# Patient Record
Sex: Male | Born: 1950 | Race: White | Hispanic: No | Smoking: Never smoker
Health system: Southern US, Community
[De-identification: ages and names within clinical notes are randomized; demographics above are authoritative.]

## PROBLEM LIST (undated history)

## (undated) DIAGNOSIS — K4090 Unilateral inguinal hernia, without obstruction or gangrene, not specified as recurrent: Secondary | ICD-10-CM

## (undated) DIAGNOSIS — E785 Hyperlipidemia, unspecified: Secondary | ICD-10-CM

## (undated) DIAGNOSIS — I10 Essential (primary) hypertension: Secondary | ICD-10-CM

## (undated) DIAGNOSIS — I4891 Unspecified atrial fibrillation: Secondary | ICD-10-CM

## (undated) DIAGNOSIS — F319 Bipolar disorder, unspecified: Secondary | ICD-10-CM

## (undated) DIAGNOSIS — I2510 Atherosclerotic heart disease of native coronary artery without angina pectoris: Secondary | ICD-10-CM

## (undated) DIAGNOSIS — M109 Gout, unspecified: Secondary | ICD-10-CM

## (undated) HISTORY — PX: CORONARY ARTERY BYPASS GRAFT: SHX141

## (undated) HISTORY — PX: KNEE ARTHROSCOPY: SUR90

## (undated) HISTORY — PX: BILATERAL HEMI SHOULDER ARTHROPLASTY: SHX6442

## (undated) HISTORY — PX: TOTAL HIP ARTHROPLASTY: SHX124

---

## 2016-03-17 ENCOUNTER — Encounter (HOSPITAL_BASED_OUTPATIENT_CLINIC_OR_DEPARTMENT_OTHER): Payer: Self-pay

## 2016-03-17 ENCOUNTER — Inpatient Hospital Stay (HOSPITAL_BASED_OUTPATIENT_CLINIC_OR_DEPARTMENT_OTHER)
Admission: EM | Admit: 2016-03-17 | Discharge: 2016-03-23 | DRG: 871 | Disposition: A | Discharge status: Skilled Nursing Facility | Payer: Medicare Other | Attending: Internal Medicine | Admitting: Internal Medicine

## 2016-03-17 ENCOUNTER — Emergency Department (HOSPITAL_BASED_OUTPATIENT_CLINIC_OR_DEPARTMENT_OTHER): Payer: Medicare Other

## 2016-03-17 DIAGNOSIS — R748 Abnormal levels of other serum enzymes: Secondary | ICD-10-CM | POA: Diagnosis not present

## 2016-03-17 DIAGNOSIS — R6521 Severe sepsis with septic shock: Secondary | ICD-10-CM | POA: Diagnosis present

## 2016-03-17 DIAGNOSIS — J9601 Acute respiratory failure with hypoxia: Secondary | ICD-10-CM | POA: Diagnosis present

## 2016-03-17 DIAGNOSIS — Z9181 History of falling: Secondary | ICD-10-CM

## 2016-03-17 DIAGNOSIS — L8990 Pressure ulcer of unspecified site, unspecified stage: Secondary | ICD-10-CM | POA: Diagnosis not present

## 2016-03-17 DIAGNOSIS — K5900 Constipation, unspecified: Secondary | ICD-10-CM | POA: Diagnosis present

## 2016-03-17 DIAGNOSIS — Z66 Do not resuscitate: Secondary | ICD-10-CM | POA: Diagnosis present

## 2016-03-17 DIAGNOSIS — Y95 Nosocomial condition: Secondary | ICD-10-CM | POA: Diagnosis present

## 2016-03-17 DIAGNOSIS — Z681 Body mass index (BMI) 19 or less, adult: Secondary | ICD-10-CM | POA: Diagnosis not present

## 2016-03-17 DIAGNOSIS — R4702 Dysphasia: Secondary | ICD-10-CM | POA: Diagnosis present

## 2016-03-17 DIAGNOSIS — E43 Unspecified severe protein-calorie malnutrition: Secondary | ICD-10-CM | POA: Diagnosis present

## 2016-03-17 DIAGNOSIS — Y92239 Unspecified place in hospital as the place of occurrence of the external cause: Secondary | ICD-10-CM | POA: Diagnosis not present

## 2016-03-17 DIAGNOSIS — F319 Bipolar disorder, unspecified: Secondary | ICD-10-CM | POA: Diagnosis present

## 2016-03-17 DIAGNOSIS — W19XXXA Unspecified fall, initial encounter: Secondary | ICD-10-CM | POA: Diagnosis not present

## 2016-03-17 DIAGNOSIS — L89510 Pressure ulcer of right ankle, unstageable: Secondary | ICD-10-CM | POA: Diagnosis present

## 2016-03-17 DIAGNOSIS — J189 Pneumonia, unspecified organism: Secondary | ICD-10-CM | POA: Diagnosis present

## 2016-03-17 DIAGNOSIS — J1289 Other viral pneumonia: Secondary | ICD-10-CM | POA: Diagnosis present

## 2016-03-17 DIAGNOSIS — Z96612 Presence of left artificial shoulder joint: Secondary | ICD-10-CM | POA: Diagnosis present

## 2016-03-17 DIAGNOSIS — E785 Hyperlipidemia, unspecified: Secondary | ICD-10-CM | POA: Diagnosis present

## 2016-03-17 DIAGNOSIS — I2510 Atherosclerotic heart disease of native coronary artery without angina pectoris: Secondary | ICD-10-CM | POA: Diagnosis present

## 2016-03-17 DIAGNOSIS — Z96611 Presence of right artificial shoulder joint: Secondary | ICD-10-CM | POA: Diagnosis present

## 2016-03-17 DIAGNOSIS — R0600 Dyspnea, unspecified: Secondary | ICD-10-CM | POA: Diagnosis not present

## 2016-03-17 DIAGNOSIS — I10 Essential (primary) hypertension: Secondary | ICD-10-CM | POA: Diagnosis present

## 2016-03-17 DIAGNOSIS — M109 Gout, unspecified: Secondary | ICD-10-CM | POA: Diagnosis present

## 2016-03-17 DIAGNOSIS — N179 Acute kidney failure, unspecified: Secondary | ICD-10-CM | POA: Diagnosis not present

## 2016-03-17 DIAGNOSIS — Z791 Long term (current) use of non-steroidal anti-inflammatories (NSAID): Secondary | ICD-10-CM

## 2016-03-17 DIAGNOSIS — F0390 Unspecified dementia without behavioral disturbance: Secondary | ICD-10-CM | POA: Diagnosis present

## 2016-03-17 DIAGNOSIS — B9729 Other coronavirus as the cause of diseases classified elsewhere: Secondary | ICD-10-CM | POA: Diagnosis present

## 2016-03-17 DIAGNOSIS — E876 Hypokalemia: Secondary | ICD-10-CM | POA: Diagnosis present

## 2016-03-17 DIAGNOSIS — A419 Sepsis, unspecified organism: Secondary | ICD-10-CM | POA: Diagnosis present

## 2016-03-17 DIAGNOSIS — Z79899 Other long term (current) drug therapy: Secondary | ICD-10-CM

## 2016-03-17 DIAGNOSIS — F419 Anxiety disorder, unspecified: Secondary | ICD-10-CM | POA: Diagnosis present

## 2016-03-17 DIAGNOSIS — S0081XA Abrasion of other part of head, initial encounter: Secondary | ICD-10-CM | POA: Diagnosis not present

## 2016-03-17 DIAGNOSIS — Z7982 Long term (current) use of aspirin: Secondary | ICD-10-CM

## 2016-03-17 DIAGNOSIS — Z951 Presence of aortocoronary bypass graft: Secondary | ICD-10-CM

## 2016-03-17 DIAGNOSIS — R1310 Dysphagia, unspecified: Secondary | ICD-10-CM | POA: Diagnosis present

## 2016-03-17 DIAGNOSIS — I4891 Unspecified atrial fibrillation: Secondary | ICD-10-CM | POA: Diagnosis present

## 2016-03-17 DIAGNOSIS — Z7901 Long term (current) use of anticoagulants: Secondary | ICD-10-CM

## 2016-03-17 HISTORY — DX: Essential (primary) hypertension: I10

## 2016-03-17 HISTORY — DX: Unspecified atrial fibrillation: I48.91

## 2016-03-17 HISTORY — DX: Gout, unspecified: M10.9

## 2016-03-17 HISTORY — DX: Hyperlipidemia, unspecified: E78.5

## 2016-03-17 HISTORY — DX: Bipolar disorder, unspecified: F31.9

## 2016-03-17 HISTORY — DX: Unilateral inguinal hernia, without obstruction or gangrene, not specified as recurrent: K40.90

## 2016-03-17 LAB — CBC WITH DIFFERENTIAL/PLATELET
BASOS ABS: 0 10*3/uL (ref 0.0–0.1)
Basophils Relative: 1 %
Eosinophils Absolute: 0 10*3/uL (ref 0.0–0.7)
Eosinophils Relative: 0 %
HEMATOCRIT: 39.5 % (ref 39.0–52.0)
Hemoglobin: 13.5 g/dL (ref 13.0–17.0)
LYMPHS PCT: 12 %
Lymphs Abs: 0.3 10*3/uL — ABNORMAL LOW (ref 0.7–4.0)
MCH: 33.4 pg (ref 26.0–34.0)
MCHC: 34.2 g/dL (ref 30.0–36.0)
MCV: 97.8 fL (ref 78.0–100.0)
MONOS PCT: 4 %
Monocytes Absolute: 0.1 10*3/uL (ref 0.1–1.0)
NEUTROS ABS: 1.7 10*3/uL (ref 1.7–7.7)
Neutrophils Relative %: 83 %
Platelets: 200 10*3/uL (ref 150–400)
RBC: 4.04 MIL/uL — ABNORMAL LOW (ref 4.22–5.81)
RDW: 11.8 % (ref 11.5–15.5)
WBC: 2 10*3/uL — ABNORMAL LOW (ref 4.0–10.5)

## 2016-03-17 LAB — COMPREHENSIVE METABOLIC PANEL
ALBUMIN: 3.3 g/dL — AB (ref 3.5–5.0)
ALT: 21 U/L (ref 17–63)
ANION GAP: 8 (ref 5–15)
AST: 43 U/L — ABNORMAL HIGH (ref 15–41)
Alkaline Phosphatase: 78 U/L (ref 38–126)
BUN: 30 mg/dL — ABNORMAL HIGH (ref 6–20)
CHLORIDE: 107 mmol/L (ref 101–111)
CO2: 25 mmol/L (ref 22–32)
Calcium: 9.2 mg/dL (ref 8.9–10.3)
Creatinine, Ser: 1.34 mg/dL — ABNORMAL HIGH (ref 0.61–1.24)
GFR calc Af Amer: >60 mL/min (ref >60)
GFR calc non Af Amer: 54 mL/min — ABNORMAL LOW (ref >60)
Glucose, Bld: 75 mg/dL (ref 65–99)
POTASSIUM: 4.3 mmol/L (ref 3.5–5.1)
SODIUM: 140 mmol/L (ref 135–145)
TOTAL PROTEIN: 6.2 g/dL — AB (ref 6.5–8.1)
Total Bilirubin: 1.1 mg/dL (ref 0.3–1.2)

## 2016-03-17 LAB — BLOOD GAS, ARTERIAL
ACID-BASE DEFICIT: 2 mmol/L (ref 0.0–2.0)
BICARBONATE: 21.5 mmol/L (ref 20.0–28.0)
DRAWN BY: 398661
FIO2: 100
O2 CONTENT: 15 L/min
O2 SAT: 91.7 %
PATIENT TEMPERATURE: 98.6
PO2 ART: 62.2 mmHg — AB (ref 83.0–108.0)
pCO2 arterial: 32 mmHg (ref 32.0–48.0)
pH, Arterial: 7.442 (ref 7.350–7.450)

## 2016-03-17 LAB — LACTIC ACID, PLASMA: Lactic Acid, Venous: 2.6 mmol/L (ref 0.5–1.9)

## 2016-03-17 LAB — I-STAT CG4 LACTIC ACID, ED
LACTIC ACID, VENOUS: 3.79 mmol/L — AB (ref 0.5–1.9)
Lactic Acid, Venous: 2.86 mmol/L (ref 0.5–1.9)

## 2016-03-17 LAB — GLUCOSE, CAPILLARY: GLUCOSE-CAPILLARY: 85 mg/dL (ref 65–99)

## 2016-03-17 LAB — PROCALCITONIN: PROCALCITONIN: 21.22 ng/mL

## 2016-03-17 LAB — MRSA PCR SCREENING: MRSA by PCR: NEGATIVE

## 2016-03-17 LAB — APTT: APTT: 52 s — AB (ref 24–36)

## 2016-03-17 LAB — HEPARIN LEVEL (UNFRACTIONATED)

## 2016-03-17 LAB — TROPONIN I: TROPONIN I: 0.51 ng/mL — AB (ref <0.03)

## 2016-03-17 MED ORDER — SODIUM CHLORIDE 0.9 % IV BOLUS (SEPSIS)
1000.0000 mL | Freq: Once | INTRAVENOUS | Status: AC
  Administered 2016-03-17: 1000 mL via INTRAVENOUS

## 2016-03-17 MED ORDER — HEPARIN (PORCINE) IN NACL 100-0.45 UNIT/ML-% IJ SOLN
750.0000 [IU]/h | INTRAMUSCULAR | Status: DC
  Administered 2016-03-17: 750 [IU]/h via INTRAVENOUS
  Filled 2016-03-17: qty 250

## 2016-03-17 MED ORDER — SODIUM CHLORIDE 0.9 % IV SOLN: 250.0000 mL | INTRAVENOUS | Status: DC | PRN

## 2016-03-17 MED ORDER — PHENYLEPHRINE HCL 10 MG/ML IJ SOLN
0.0000 ug/min | Freq: Once | INTRAVENOUS | Status: AC
  Administered 2016-03-17: 20 ug/min via INTRAVENOUS
  Filled 2016-03-17: qty 1

## 2016-03-17 MED ORDER — SODIUM CHLORIDE 0.9 % IV BOLUS (SEPSIS)
500.0000 mL | Freq: Once | INTRAVENOUS | Status: AC
  Administered 2016-03-17: 500 mL via INTRAVENOUS

## 2016-03-17 MED ORDER — PHENYLEPHRINE HCL 10 MG/ML IJ SOLN
INTRAMUSCULAR | Status: AC
  Filled 2016-03-17: qty 1

## 2016-03-17 MED ORDER — SODIUM CHLORIDE 0.9 % IV BOLUS (SEPSIS)
250.0000 mL | Freq: Once | INTRAVENOUS | Status: AC
  Administered 2016-03-17: 250 mL via INTRAVENOUS

## 2016-03-17 MED ORDER — VANCOMYCIN HCL IN DEXTROSE 1-5 GM/200ML-% IV SOLN
1000.0000 mg | Freq: Once | INTRAVENOUS | Status: AC
  Administered 2016-03-17: 1000 mg via INTRAVENOUS
  Filled 2016-03-17: qty 200

## 2016-03-17 MED ORDER — DEXTROSE 5 % IV SOLN
2.0000 g | Freq: Once | INTRAVENOUS | Status: AC
  Administered 2016-03-17: 2 g via INTRAVENOUS

## 2016-03-17 MED ORDER — PHENYLEPHRINE HCL 10 MG/ML IJ SOLN
0.0000 ug/min | INTRAVENOUS | Status: DC
  Administered 2016-03-17: 50 ug/min via INTRAVENOUS
  Filled 2016-03-17: qty 1

## 2016-03-17 MED ORDER — VANCOMYCIN HCL 500 MG IV SOLR
500.0000 mg | Freq: Two times a day (BID) | INTRAVENOUS | Status: DC
  Administered 2016-03-18 – 2016-03-19 (×3): 500 mg via INTRAVENOUS
  Filled 2016-03-17 (×5): qty 500

## 2016-03-17 MED ORDER — ASPIRIN 81 MG PO CHEW
81.0000 mg | CHEWABLE_TABLET | Freq: Every day | ORAL | Status: DC
  Administered 2016-03-18: 81 mg via ORAL
  Filled 2016-03-17: qty 1

## 2016-03-17 MED ORDER — KETAMINE HCL 10 MG/ML IJ SOLN: 0.5000 mg/kg | Freq: Once | INTRAMUSCULAR | Status: DC

## 2016-03-17 MED ORDER — DEXTROSE 5 % IV SOLN
1.0000 g | INTRAVENOUS | Status: DC
  Filled 2016-03-17: qty 1

## 2016-03-17 MED ORDER — PHENYLEPHRINE HCL 10 MG/ML IJ SOLN: 0.0000 ug/min | INTRAMUSCULAR | Status: DC

## 2016-03-17 MED ORDER — ACETAMINOPHEN 325 MG PO TABS
650.0000 mg | ORAL_TABLET | Freq: Once | ORAL | Status: AC
  Administered 2016-03-17: 650 mg via ORAL
  Filled 2016-03-17: qty 2

## 2016-03-17 MED ORDER — SODIUM CHLORIDE 0.9 % IV SOLN
INTRAVENOUS | Status: DC
  Administered 2016-03-17 – 2016-03-18 (×2): via INTRAVENOUS

## 2016-03-17 MED ORDER — SODIUM CHLORIDE 0.9 % IV SOLN
0.0000 ug/min | INTRAVENOUS | Status: DC
  Administered 2016-03-17 – 2016-03-18 (×3): 50 ug/min via INTRAVENOUS
  Filled 2016-03-17 (×3): qty 1

## 2016-03-17 MED ORDER — CEFEPIME HCL 2 G IJ SOLR
INTRAMUSCULAR | Status: AC
  Filled 2016-03-17: qty 2

## 2016-03-17 MED ORDER — OSELTAMIVIR PHOSPHATE 30 MG PO CAPS
30.0000 mg | ORAL_CAPSULE | Freq: Two times a day (BID) | ORAL | Status: DC
  Administered 2016-03-17 – 2016-03-18 (×2): 30 mg via ORAL
  Filled 2016-03-17 (×2): qty 1

## 2016-03-17 MED ORDER — PRAVASTATIN SODIUM 40 MG PO TABS
40.0000 mg | ORAL_TABLET | Freq: Every day | ORAL | Status: DC
  Administered 2016-03-18 – 2016-03-22 (×5): 40 mg via ORAL
  Filled 2016-03-17 (×6): qty 1

## 2016-03-17 MED ORDER — HEPARIN SODIUM (PORCINE) 5000 UNIT/ML IJ SOLN: 5000.0000 [IU] | Freq: Three times a day (TID) | INTRAMUSCULAR | Status: DC

## 2016-03-17 NOTE — Progress Notes (Signed)
Paged Resident on call regarding intermittent bradycardia into low 40's.

## 2016-03-17 NOTE — H&P (Addendum)
PULMONARY / CRITICAL CARE MEDICINE   Name: Ricardo Hendricks MRN: [id number] DOB: [birth year]    ADMISSION DATE:  03/17/2016 CONSULTATION DATE:  03/17/16  REFERRING MD:  Lynelle Doctor - EDP  CHIEF COMPLAINT:  Cough  HISTORY OF PRESENT ILLNESS:  Ricardo Hendricks is a 66 y.o. male with PMH as outlined below and who resides at nursing home. She presented to [facility] ED 03/17/16 with cough and congestion.  He had apparently been fine 2 - 3 days prior over the weekend.  When family went to check on him 02/20, they noted that he had rattling sound in his chest.  He had reportedly not had any fevers/chills/sweats, chest pain, N/V/D, abd pain.  In ED, he was found to be hypotensive and CXR was c/w PNA.  BP remained soft despite IVF resuscitation; therefore, pt was started on neosynephrine.  He was subsequently transferred to [facility] for further evaluation and management.  Per family, pt had recent admission this past winter for sepsis.  No notes in our system.   PAST MEDICAL HISTORY :  He  has a past medical history of A-fib (HCC); Bipolar 1 disorder (HCC); Gout; Hernia of testicle; Hyperlipemia; and Hypertension.  PAST SURGICAL HISTORY: He  has a past surgical history that includes Coronary artery bypass graft; Total hip arthroplasty; Knee arthroscopy; and Bilateral hemi shoulder arthroplasty.  No Known Allergies  No current facility-administered medications on file prior to encounter.    No current outpatient prescriptions on file prior to encounter.    FAMILY HISTORY:  His has no family status information on file.    SOCIAL HISTORY: He  reports that he has never smoked. He has never used smokeless tobacco. He reports that he does not drink alcohol or use drugs.  REVIEW OF SYSTEMS:   All negative; except for those that are bolded, which indicate positives.  Constitutional: weight loss, weight gain, night sweats, fevers, chills, fatigue, weakness.  HEENT: headaches, sore throat, sneezing, nasal  congestion, post nasal drip, difficulty swallowing, tooth/dental problems, visual complaints, visual changes, ear aches. Neuro: difficulty with speech, weakness, numbness, ataxia. CV:  chest pain, orthopnea, PND, swelling in lower extremities, dizziness, palpitations, syncope.  Resp: cough, hemoptysis, dyspnea, wheezing. GI: heartburn, indigestion, abdominal pain, nausea, vomiting, diarrhea, constipation, change in bowel habits, loss of appetite, hematemesis, melena, hematochezia.  GU: dysuria, change in color of urine, urgency or frequency, flank pain, hematuria. MSK: joint pain or swelling, decreased range of motion. Psych: change in mood or affect, depression, anxiety, suicidal ideations, homicidal ideations. Skin: rash, itching, bruising.   SUBJECTIVE:  Denies chest pain, SOB.  VITAL SIGNS: BP 93/67   Pulse (!) 55   Temp 100.2 F (37.9 C)   Resp 26   Wt 54.4 kg (120 lb)   SpO2 91%   HEMODYNAMICS:    VENTILATOR SETTINGS:    INTAKE / OUTPUT: I/O last 3 completed shifts: In: 2000 [IV Piggyback:2000] Out: -    PHYSICAL EXAMINATION: General: Adult male, resting in bed, in NAD. Neuro: Awake, alert to person and place, not time. HEENT: [place]/AT. PERRL, sclerae anicteric. Cardiovascular: IRIR, no M/R/G.  Lungs: Respirations even and unlabored.  CTA bilaterally, No W/R/R. Abdomen: BS x 4, soft, NT/ND.  Musculoskeletal: No gross deformities, no edema.  Skin: Multiple bruises to LE extremities, warm, no rashes.     LABS:  BMET  Recent Labs Lab 03/17/16 1529  NA 140  K 4.3  CL 107  CO2 25  BUN 30*  CREATININE 1.34*  GLUCOSE 75  Electrolytes  Recent Labs Lab 03/17/16 1529  CALCIUM 9.2    CBC  Recent Labs Lab 03/17/16 1529  WBC 2.0*  HGB 13.5  HCT 39.5  PLT 200    Coag's No results for input(s): APTT, INR in the last 168 hours.  Sepsis Markers  Recent Labs Lab 03/17/16 1537 03/17/16 1818  LATICACIDVEN 3.79* 2.86*    ABG No results  for input(s): PHART, PCO2ART, PO2ART in the last 168 hours.  Liver Enzymes  Recent Labs Lab 03/17/16 1529  AST 43*  ALT 21  ALKPHOS 78  BILITOT 1.1  ALBUMIN 3.3*    Cardiac Enzymes No results for input(s): TROPONINI, PROBNP in the last 168 hours.  Glucose No results for input(s): GLUCAP in the last 168 hours.  Imaging Dg Chest 2 View  Result Date: 03/17/2016 CLINICAL DATA:  66 year old male with cough and fever for several days. Initial encounter. EXAM: CHEST  2 VIEW COMPARISON:  Chest radiographs 01/03/2016 and earlier. FINDINGS: Confluent bilateral lower lobe opacity. Questionable middle lobe or lingula involvement. No definite pleural effusion. Superimposed chronic pulmonary hyperinflation. No pneumothorax or edema. Stable cardiac size and mediastinal contours. Osteopenia. No acute osseous abnormality identified. IMPRESSION: Bilateral lower lobe pneumonia. No definite pleural effusion. Chronic pulmonary hyperinflation. Followup PA and lateral chest X-ray is recommended in 3-4 weeks following trial of antibiotic therapy to ensure resolution and exclude underlying malignancy. Electronically Signed   By: Odessa Fleming M.D.   On: 03/17/2016 14:37     STUDIES:  CXR 02/20 > b/l lower lobe PNA.  CULTURES: Blood 02/20 >  Sputum 02/20 >  Flu PCR 02/20 >   ANTIBIOTICS: Vanc 02/20 >  Cefepime 02/20 >   SIGNIFICANT EVENTS: 02/20 > admit.  LINES/TUBES: None.  DISCUSSION: 66 y.o. male from nursing home, transferred from [facility] to [facility] with septic shock due to HCAP.  ASSESSMENT / PLAN:  PULMONARY A: HCAP. Acute hypoxic respiratory failure - due to above. R/o influenza. P:   Empiric abx / cultures per ID section. Continue supplemental O2 as needed to maintain SpO2 > 92%. F/u flu PCR. Pulmonary hygiene. CXR in AM.  INFECTIOUS A:   HCAP. P:   Abx as above (vanc / zosyn).  Follow cultures as above. PCT algorithm to limit abx exposure.  CARDIOVASCULAR A:  Hx HTN, HLD,  A.fib (rivaroxaban). P:  Monitor hemodynamics. Trend troponin, lactate. Heparin gtt in lieu of preadmission rivaroxaban. Continue preadmission ASA, pravastatin. Hold preadmission diltiazem, rivaroxaban.  RENAL A:   AKI. P:   NS @ 75. BMP in AM.  GASTROINTESTINAL A:   Nutrition. P:   NPO.  HEMATOLOGIC A:   VTE Prophylaxis. P:  SCD's / heparin gtt. CBC in AM.  ENDOCRINE A:   No acute issues.   P:   No interventions required.  NEUROLOGIC A:   No acute issues. P:   No interventions required. Hold preadmission clonazepam, mirtazapine.  Family updated: None available.  Interdisciplinary Family Meeting v Palliative Care Meeting:  Due by: 03/23/16.  CC time: 30 min.   Rutherford Guys, [place] [facility] [place] [facility] Pager: [phone]  or [phone] 03/17/2016, 8:04 PM  ATTENDING NOTE / ATTESTATION NOTE :   I have discussed the case with the resident/APP  Rutherford Guys PA.   I agree with the resident/APP's  history, physical examination, assessment, and plans.    I have edited the above note and modified it according to our agreed history, physical examination,  assessment and plan.   Briefly, SNF resident who [facility] ED [facility]/17/16 with cough and congestion.  He had apparently been fine 2 - 3 days prior over the weekend.  When family went to check on him 02/20, they noted that he had rattling sound in his chest.  He had reportedly not had any fevers/chills/sweats, chest pain, N/V/D, abd pain. He denies recent infection.  Non smoker, denies any previous lung disease, cancer, blood clots.    In ED, he was found to be hypotensive and CXR was c/w PNA.  BP remained soft despite IVF resuscitation; therefore, pt was started on neosynephrine.  He was subsequently [facility] evaluation and management. On transfer, his O2 saturation was in the mid 70s on nasal cannula. He has since been switched to a nonrebreather mask and  his O2 saturation hovers around mid 90s. He states he feels better compared to when he came in. Patient denies difficulty swallowing.  Vitals:  Vitals:   03/17/16 2245 03/17/16 2300 03/17/16 2315 03/17/16 2330  BP: (!) 83/66 (!) 88/68 (!) 80/63 (!) 94/56  Pulse: 63 64 61 65  Resp: (!) 25 (!) 28 (!) 30 (!) 28  Temp:      TempSrc:      SpO2: 93% 91% 97% 97%  Weight:      Height:        Constitutional/General: Pleasantly confused, in mild respiratory distress. On nonrebreather mask. Chronically ill.  Body mass index is 18.33 kg/m. Wt Readings from Last 3 Encounters:  03/17/16 59.6 kg (131 lb 6.3 oz)    HEENT: PERLA, anicteric sclerae. Nonrebreather mask.   Neck: No masses. Midline trachea. No JVD, (-) LAD. (-) bruits appreciated.  Respiratory/Chest: Grossly normal chest. (-) deformity. Some Accessory muscle use.  Symmetric expansion. Diminished BS on both lower lung zones. (-) wheezing,rhonchi Crackles bibasilar (-) egophony  Cardiovascular: Regular rate and  rhythm, heart sounds normal, no murmur or gallops,  Trace peripheral edema  Gastrointestinal:  Normal bowel sounds. Soft, non-tender. No hepatosplenomegaly.  (-) masses.   Musculoskeletal:  Note of muscle wasting.   Extremities: Grossly normal. (-) clubbing, cyanosis.  Trace edema  Skin: (-) rash,lesions seen.   Neurological/Psychiatric :  CN grossly intact. (-) lateralizing signs. Awake, oriented 3, pleasantly confused at times.    CBC Recent Labs     03/17/16  1529  WBC  2.0*  HGB  13.5  HCT  39.5  PLT  200    Coag's Recent Labs     03/17/16  2204  APTT  52*    BMET Recent Labs     03/17/16  1529  NA  140  K  4.3  CL  107  CO2  25  BUN  30*  CREATININE  1.34*  GLUCOSE  75    Electrolytes Recent Labs     03/17/16  1529  CALCIUM  9.2    Sepsis Markers Recent Labs     03/17/16  2204  PROCALCITON  21.22    ABG Recent Labs     03/17/16  2305  PHART  7.442  PCO2ART   32.0  PO2ART  62.2*    Liver Enzymes Recent Labs     03/17/16  1529  AST  43*  ALT  21  ALKPHOS  78  BILITOT  1.1  ALBUMIN  3.3*    Cardiac Enzymes Recent Labs     03/17/16  2204  TROPONINI  0.51*    Glucose Recent  Labs     03/17/16  2041  GLUCAP  85    Imaging Dg Chest 2 View  Result Date: 03/17/2016 CLINICAL DATA:  66 year old male with cough and fever for several days. Initial encounter. EXAM: CHEST  2 VIEW COMPARISON:  Chest radiographs 01/03/2016 and earlier. FINDINGS: Confluent bilateral lower lobe opacity. Questionable middle lobe or lingula involvement. No definite pleural effusion. Superimposed chronic pulmonary hyperinflation. No pneumothorax or edema. Stable cardiac size and mediastinal contours. Osteopenia. No acute osseous abnormality identified. IMPRESSION: Bilateral lower lobe pneumonia. No definite pleural effusion. Chronic pulmonary hyperinflation. Followup PA and lateral chest X-ray is recommended in 3-4 weeks following trial of antibiotic therapy to ensure resolution and exclude underlying malignancy. Electronically Signed   By: Odessa Fleming M.D.   On: 03/17/2016 14:37   Assessment/Plan : Acute hypoxemic respiratory failure secondary to bibasilar healthcare associated pneumonia/asp pna +/- Concern for influenza + Demand ischemia - Currently on nonrebreather mask. Low threshold for intubation. - I have asked RN and RT to try to cut down Fio2 >> if unsuccessful, we may need to intubate the patient. Bipap is also another option.  - Broad spectrum antibiotics with cefepime and vancomycin. - start Tamiflu while waiting for influenza PCR.  - Pancultrue - Cycle troponin. Check 2-D echo. - Patient has received 2.5 L of fluid. Cont maintenance fluid. Continue Neo-Synephrine drip. - Pulmicort neb BID and duoneb QID - Keep NPO   Lactic acidosis 2/2 HCAP - Patient has been volume resuscitated. He received 2.5 L of IV fluid. Currently on Neo-Synephrine drip. - trend  lactate   Leukopenia, likely 2/2 sepsis/HCAP - observe for now. - Cont abx.    H/O atrial fibrilation. HTN - switch NOAC to heparin drip - hold off on BP meds 2/2 hypotension   Bipolar Disorder. Baseline dementia - resume clonazepam and remeron once BP is better   Best practice - heparin drip - pepcid for SUP  I spent  30  minutes of Critical Care time with this patient today. This is my time spent independent of the APP or resident.   Family :  No family at bedside.    Pollie Meyer, MD 03/17/2016, 11:47 PM [place] [facility] Pager [phone] After 3 pm or if no answer, call [phone]

## 2016-03-17 NOTE — ED Provider Notes (Signed)
[facility]Y [facility] Provider Note   CSN: [id number] Arrival date & time: 03/17/16  1339     History   Chief Complaint Chief Complaint  Patient presents with  . Cough    HPI Milton Sagona is a 66 y.o. male.  HPI Pt presents to the ED with cough and congestion.  He seemed fine this weekend.  Today when family went to check on him they noticed a rattling in his chest.  Pt at baseline does not move around very much.  He spends most of the day in bed.  No vomiting or diarrhea.  Normal bowel movement this am.  No increased weakness.  Family thought he was a little confused though.  Pt has had the pneumovax and did get the flu shot this year.  Pt was hospitalized sometime this winter for possible sepsis.  Within 90 days Past Medical History:  Diagnosis Date  . A-fib (HCC)   . Bipolar 1 disorder (HCC)   . Gout   . Hernia of testicle   . Hyperlipemia   . Hypertension     Patient Active Problem List   Diagnosis Date Noted  . Sepsis (HCC) 03/17/2016    Past Surgical History:  Procedure Laterality Date  . BILATERAL HEMI SHOULDER ARTHROPLASTY    . CORONARY ARTERY BYPASS GRAFT    . KNEE ARTHROSCOPY    . TOTAL HIP ARTHROPLASTY         Home Medications    Prior to Admission medications   Medication Sig Start Date End Date Taking? Authorizing Provider  allopurinol (ZYLOPRIM) 300 MG tablet Take 300 mg by mouth daily.   Yes Historical Provider, MD  aspirin EC 81 MG tablet Take 81 mg by mouth daily.   Yes Historical Provider, MD  clonazePAM (KLONOPIN) 1 MG tablet Take 1 mg by mouth 2 (two) times daily.   Yes Historical Provider, MD  diltiazem (DILTIAZEM CD) 240 MG 24 hr capsule Take 240 mg by mouth daily.   Yes Historical Provider, MD  docusate sodium (COLACE) 100 MG capsule Take 100 mg by mouth 2 (two) times daily.   Yes Historical Provider, MD  meloxicam (MOBIC) 15 MG tablet Take 15 mg by mouth daily.   Yes Historical Provider, MD  mirtazapine (REMERON) 15 MG tablet  Take 15 mg by mouth at bedtime.   Yes Historical Provider, MD  Multiple Vitamin (MULTIVITAMIN) capsule Take 1 capsule by mouth daily.   Yes Historical Provider, MD  polyethylene glycol (MIRALAX / GLYCOLAX) packet Take 17 g by mouth daily.   Yes Historical Provider, MD  pravastatin (PRAVACHOL) 40 MG tablet Take 40 mg by mouth daily.   Yes Historical Provider, MD  rivaroxaban (XARELTO) 20 MG TABS tablet Take 20 mg by mouth daily with supper.   Yes Historical Provider, MD    Family History No family history on file.  Social History Social History  Substance Use Topics  . Smoking status: Never Smoker  . Smokeless tobacco: Never Used  . Alcohol use No     Allergies   Patient has no known allergies.   Review of Systems Review of Systems   Physical Exam Updated Vital Signs BP (!) 76/60   Pulse 110   Temp 100.3 F (37.9 C) (Oral)   Resp (!) 28   Wt 54.4 kg   SpO2 93%   Physical Exam  Constitutional: No distress.  HENT:  Head: Normocephalic and atraumatic.  Right Ear: External ear normal.  Left Ear: External ear normal.  Eyes: Conjunctivae are normal. Right eye exhibits no discharge. Left eye exhibits no discharge. No scleral icterus.  Neck: Neck supple. No tracheal deviation present.  Cardiovascular: Normal rate, regular rhythm and intact distal pulses.   Pulmonary/Chest: Effort normal. No stridor. No respiratory distress. He has rales.  ronchi   Abdominal: Soft. Bowel sounds are normal. He exhibits no distension. There is no tenderness. There is no rebound and no guarding.  Musculoskeletal: He exhibits no edema or tenderness.  Neurological: He is alert. No cranial nerve deficit (no facial droop, extraocular movements intact, no slurred speech) or sensory deficit. He exhibits normal muscle tone. He displays no seizure activity. Coordination normal.  Generalized weakness, requires assistance to transfer form the wheelchair to the bed  Skin: Skin is warm and dry. No rash  noted. He is not diaphoretic.  Psychiatric: He has a normal mood and affect.  Nursing note and vitals reviewed.    ED Treatments / Results  Labs (all labs ordered are listed, but only abnormal results are displayed) Labs Reviewed  COMPREHENSIVE METABOLIC PANEL - Abnormal; Notable for the following:       Result Value   BUN 30 (*)    Creatinine, Ser 1.34 (*)    Total Protein 6.2 (*)    Albumin 3.3 (*)    AST 43 (*)    GFR calc non Af Amer 54 (*)    All other components within normal limits  CBC WITH DIFFERENTIAL/PLATELET - Abnormal; Notable for the following:    WBC 2.0 (*)    RBC 4.04 (*)    Lymphs Abs 0.3 (*)    All other components within normal limits  I-STAT CG4 LACTIC ACID, ED - Abnormal; Notable for the following:    Lactic Acid, Venous 3.79 (*)    All other components within normal limits  I-STAT CG4 LACTIC ACID, ED - Abnormal; Notable for the following:    Lactic Acid, Venous 2.86 (*)    All other components within normal limits  CULTURE, BLOOD (ROUTINE X 2)  CULTURE, BLOOD (ROUTINE X 2)  URINE CULTURE  URINALYSIS, ROUTINE W REFLEX MICROSCOPIC    EKG  EKG Interpretation  Date/Time:  Tuesday March 17 2016 15:58:07 EST Ventricular Rate:  134 PR Interval:    QRS Duration: 93 QT Interval:  292 QTC Calculation: 436 R Axis:   81 Text Interpretation:  Atrial fibrillation Borderline right axis deviation Anteroseptal infarct, old Nonspecific repol abnormality, diffuse leads No previous tracing Confirmed by Yamili Lichtenwalner  MD-J, Nataliah Hatlestad ([id number]) on 03/17/2016 4:14:50 PM       Radiology Dg Chest 2 View  Result Date: 03/17/2016 CLINICAL DATA:  66 year old male with cough and fever for several days. Initial encounter. EXAM: CHEST  2 VIEW COMPARISON:  Chest radiographs 01/03/2016 and earlier. FINDINGS: Confluent bilateral lower lobe opacity. Questionable middle lobe or lingula involvement. No definite pleural effusion. Superimposed chronic pulmonary hyperinflation. No pneumothorax  or edema. Stable cardiac size and mediastinal contours. Osteopenia. No acute osseous abnormality identified. IMPRESSION: Bilateral lower lobe pneumonia. No definite pleural effusion. Chronic pulmonary hyperinflation. Followup PA and lateral chest X-ray is recommended in 3-4 weeks following trial of antibiotic therapy to ensure resolution and exclude underlying malignancy. Electronically Signed   By: Odessa Fleming M.D.   On: 03/17/2016 14:37    Procedures .Critical Care Performed by: Linwood Dibbles Authorized by: Linwood Dibbles   Critical care provider statement:    Critical care time (minutes):  45   Critical care was time spent personally by  me on the following activities:  Discussions with consultants, evaluation of patient's response to treatment, examination of patient, ordering and performing treatments and interventions, ordering and review of laboratory studies, ordering and review of radiographic studies, pulse oximetry, re-evaluation of patient's condition, obtaining history from patient or surrogate and review of old charts   (including critical care time)  Medications Ordered in ED Medications  ceFEPIme (MAXIPIME) 2 g injection (not administered)  vancomycin (VANCOCIN) 500 mg in sodium chloride 0.9 % 100 mL IVPB (not administered)  ceFEPIme (MAXIPIME) 1 g in dextrose 5 % 50 mL IVPB (not administered)  phenylephrine (NEO-SYNEPHRINE) 10 mg in dextrose 5 % 250 mL (0.04 mg/mL) infusion (not administered)  sodium chloride 0.9 % bolus 1,000 mL (not administered)  sodium chloride 0.9 % bolus 1,000 mL (0 mLs Intravenous Stopped 03/17/16 1616)    And  sodium chloride 0.9 % bolus 500 mL (0 mLs Intravenous Stopped 03/17/16 1606)    And  sodium chloride 0.9 % bolus 250 mL (0 mLs Intravenous Stopped 03/17/16 1644)  ceFEPIme (MAXIPIME) 2 g in dextrose 5 % 50 mL IVPB (0 g Intravenous Stopped 03/17/16 1644)  vancomycin (VANCOCIN) IVPB 1000 mg/200 mL premix (0 mg Intravenous Stopped 03/17/16 1745)  acetaminophen  (TYLENOL) tablet 650 mg (650 mg Oral Given 03/17/16 1633)     Initial Impression / Assessment and Plan / ED Course  I have reviewed the triage vital signs and the nursing notes.  Pertinent labs & imaging results that were available during my care of the patient were reviewed by me and considered in my medical decision making (see chart for details).  Clinical Course as of Mar 17 1842  Tue Mar 17, 2016  1727 BP ins the 90s systolic.  HR 120s.  Known a fib.  Sx concerning for sepsis.  He has already received his 30 cc/kg bolus.  Will consult with critical care for transfer  [JK]  1840 Patient has received his 30 mL/kg bolus. Most recent blood pressure 76/60 despite an improvement in his lactic acid level. I will order another liter of IV fluids and start him on Neo-Synephrine.  I spoke with Dr. Cathi Roan will arrange for critical care admission at [facility]  [JK]  1844 Patient does have a known history of atrial fibrillation  [JK]    Clinical Course User Index [JK] Linwood Dibbles, MD    Patient presented to the emergency room with cough and fever. His symptoms are suggestive of pneumonia with sepsis.   Patient was treated with broad-spectrum antibiotics. He was also given fluid resuscitation. Despite his treatment his blood pressure remained persistently low.  I have ordered Neo-Synephrine. Plan on transport to [facility] for admission to the ICU  Final Clinical Impressions(s) / ED Diagnoses   Final diagnoses:  Sepsis, due to unspecified organism [facility])  HCAP (healthcare-associated pneumonia)      Linwood Dibbles, MD 03/17/16 [phone]

## 2016-03-17 NOTE — Progress Notes (Signed)
Pharmacy Antibiotic Note  Ricardo MayWilliam Hendricks is a 66 y.o. male admitted on 03/17/2016 with pneumonia. Pt was hospitalized at OSH recently for sepsis, so there is concern for HCAP.  Pharmacy has been consulted for vancomycin and cefepime dosing.  Plan: -Cefepime 2g IV x1 per MD then 1g IV q24h -Vancomycin 1g IV x1 per MD then 500mg  IV 12h -Monitor renal function, cultures, LOT -Obtain vancomycin level as indicated  Weight: 120 lb (54.4 kg)  Temp (24hrs), Avg:101.1 F (38.4 C), Min:101.1 F (38.4 C), Max:101.1 F (38.4 C)   Recent Labs Lab 03/17/16 1529 03/17/16 1537  WBC 2.0*  --   LATICACIDVEN  --  3.79*    CrCl cannot be calculated (No order found.).    No Known Allergies  Antimicrobials this admission: 2/20 Vancomycin >> 2/20 Cefepime >>   Dose adjustments this admission: none  Microbiology results: 2/20 BCx: IP 2/20 UCx: IP  Thank you for allowing pharmacy to be a part of this patient's care.  Fredonia HighlandMichael Bitonti, PharmD PGY-1 Pharmacy Resident Pager: [phone] 03/17/2016

## 2016-03-17 NOTE — ED Triage Notes (Signed)
Pt presents with cough, fever, and increased breathing.

## 2016-03-17 NOTE — ED Notes (Signed)
Pt is aware that a urine sample is needed but is unable to obtain one at this time. 

## 2016-03-17 NOTE — ED Notes (Signed)
Lactic Acid 2.86 reported to Dr. Lynelle DoctorKnapp

## 2016-03-17 NOTE — Progress Notes (Signed)
CRITICAL VALUE ALERT  Critical value received:  Lactic Acid 2.6, Troponin 0.51  Date of notification:  03/17/16  Time of notification:  2326  Critical value read back:Yes.    Nurse who received alert:  Abe PeopleStephanie Gian Ybarra, RN  MD notified (1st page):  [place]  Time of first page:  2326  MD notified (2nd page):  Time of second page:  Responding MD:  Leanord AsalMcMikell  Time MD responded:  2326

## 2016-03-17 NOTE — ED Notes (Signed)
[facility].

## 2016-03-17 NOTE — ED Notes (Signed)
Pt was reminded that a urine sample is needed, pt is still unable to obtain one at this time.

## 2016-03-17 NOTE — Progress Notes (Addendum)
ANTICOAGULATION CONSULT NOTE - Initial Consult  Pharmacy Consult:  Heparin Indication: atrial fibrillation  No Known Allergies  Patient Measurements: Height: 5\' 11"  (180.3 cm) Weight: 131 lb 6.3 oz (59.6 kg) IBW/kg (Calculated) : 75.3 Heparin Dosing Weight: 60 kg  Vital Signs: Temp: 99.4 F (37.4 C) (02/20 2047) Temp Source: Oral (02/20 2047) BP: 93/67 (02/20 1930) Pulse Rate: 55 (02/20 1930)  Labs:  Recent Labs  03/17/16 1529  HGB 13.5  HCT 39.5  PLT 200  CREATININE 1.34*    Estimated Creatinine Clearance: 45.7 mL/min (by C-G formula based on SCr of 1.34 mg/dL (H)).   Medical History: Past Medical History:  Diagnosis Date  . A-fib (HCC)   . Bipolar 1 disorder (HCC)   . Gout   . Hernia of testicle   . Hyperlipemia   . Hypertension      Assessment: [age] YOM [facility]16 with cough and congestion.  Patient was hypotensive, requiring pressor and [facility].  Pharmacy consulted to initiate IV heparin for history of Afib while Xarelto is on hold.    Patient is unsure when he last took Xarelto, possibly last night with supper.  Available baseline labs reviewed.   Goal of Therapy:  Heparin level 0.3-0.7 units/ml aPTT 66 - 102 seconds Monitor platelets by anticoagulation protocol: Yes    Plan:  - D/C heparin SQ - Check stat aPTT and heparin level prior to initiating heparin   Labrian Torregrossa D. Laney Potashang, PharmD, BCPS Pager:  [phone] 03/17/2016, 9:53 PM    =============================   Addendum:  - baseline labs are delayed - start heparin gtt at 750 units/hr, no bolus - check 6 hr aPTT, HL - daily heparin level, aPTT, CBC    Liala Codispoti D. Laney Potashang, PharmD, BCPS Pager:  [phone] 03/17/2016, 10:35 PM

## 2016-03-18 ENCOUNTER — Inpatient Hospital Stay (HOSPITAL_COMMUNITY): Payer: Medicare Other

## 2016-03-18 DIAGNOSIS — N179 Acute kidney failure, unspecified: Secondary | ICD-10-CM

## 2016-03-18 DIAGNOSIS — J189 Pneumonia, unspecified organism: Secondary | ICD-10-CM

## 2016-03-18 DIAGNOSIS — R0600 Dyspnea, unspecified: Secondary | ICD-10-CM

## 2016-03-18 LAB — BASIC METABOLIC PANEL
ANION GAP: 8 (ref 5–15)
BUN: 29 mg/dL — AB (ref 6–20)
CO2: 22 mmol/L (ref 22–32)
Calcium: 8.3 mg/dL — ABNORMAL LOW (ref 8.9–10.3)
Chloride: 109 mmol/L (ref 101–111)
Creatinine, Ser: 1.08 mg/dL (ref 0.61–1.24)
GFR calc Af Amer: >60 mL/min (ref >60)
GFR calc non Af Amer: >60 mL/min (ref >60)
GLUCOSE: 70 mg/dL (ref 65–99)
POTASSIUM: 5.1 mmol/L (ref 3.5–5.1)
Sodium: 139 mmol/L (ref 135–145)

## 2016-03-18 LAB — RESPIRATORY PANEL BY PCR
ADENOVIRUS-RVPPCR: NOT DETECTED
Bordetella pertussis: NOT DETECTED
CORONAVIRUS 229E-RVPPCR: NOT DETECTED
CORONAVIRUS NL63-RVPPCR: NOT DETECTED
CORONAVIRUS OC43-RVPPCR: NOT DETECTED
Chlamydophila pneumoniae: NOT DETECTED
Coronavirus HKU1: DETECTED — AB
Influenza A: NOT DETECTED
Influenza B: NOT DETECTED
MYCOPLASMA PNEUMONIAE-RVPPCR: NOT DETECTED
Metapneumovirus: NOT DETECTED
PARAINFLUENZA VIRUS 1-RVPPCR: NOT DETECTED
PARAINFLUENZA VIRUS 2-RVPPCR: NOT DETECTED
PARAINFLUENZA VIRUS 4-RVPPCR: NOT DETECTED
Parainfluenza Virus 3: NOT DETECTED
Respiratory Syncytial Virus: NOT DETECTED
Rhinovirus / Enterovirus: NOT DETECTED

## 2016-03-18 LAB — URINALYSIS, ROUTINE W REFLEX MICROSCOPIC
BILIRUBIN URINE: NEGATIVE
GLUCOSE, UA: NEGATIVE mg/dL
KETONES UR: NEGATIVE mg/dL
Leukocytes, UA: NEGATIVE
NITRITE: NEGATIVE
PH: 5 (ref 5.0–8.0)
Protein, ur: NEGATIVE mg/dL
Specific Gravity, Urine: 1.021 (ref 1.005–1.030)
Squamous Epithelial / HPF: NONE SEEN

## 2016-03-18 LAB — LACTIC ACID, PLASMA: Lactic Acid, Venous: 3.4 mmol/L (ref 0.5–1.9)

## 2016-03-18 LAB — PROCALCITONIN: PROCALCITONIN: 25.89 ng/mL

## 2016-03-18 LAB — INFLUENZA PANEL BY PCR (TYPE A & B)
INFLBPCR: NEGATIVE
Influenza A By PCR: NEGATIVE

## 2016-03-18 LAB — ECHOCARDIOGRAM COMPLETE
HEIGHTINCHES: 71 in
WEIGHTICAEL: 2102.31 [oz_av]

## 2016-03-18 LAB — STREP PNEUMONIAE URINARY ANTIGEN: Strep Pneumo Urinary Antigen: NEGATIVE

## 2016-03-18 LAB — TROPONIN I
Troponin I: 0.69 ng/mL (ref <0.03)
Troponin I: 0.56 ng/mL (ref <0.03)

## 2016-03-18 LAB — APTT
aPTT: 67 seconds — ABNORMAL HIGH (ref 24–36)
aPTT: 35 seconds (ref 24–36)

## 2016-03-18 LAB — MAGNESIUM: Magnesium: 1.7 mg/dL (ref 1.7–2.4)

## 2016-03-18 LAB — CBC
HEMATOCRIT: 33.3 % — AB (ref 39.0–52.0)
Hemoglobin: 11.1 g/dL — ABNORMAL LOW (ref 13.0–17.0)
MCH: 32.7 pg (ref 26.0–34.0)
MCHC: 33.3 g/dL (ref 30.0–36.0)
MCV: 98.2 fL (ref 78.0–100.0)
Platelets: 177 10*3/uL (ref 150–400)
RBC: 3.39 MIL/uL — AB (ref 4.22–5.81)
RDW: 12.4 % (ref 11.5–15.5)
WBC: 8.7 10*3/uL (ref 4.0–10.5)

## 2016-03-18 LAB — PHOSPHORUS: Phosphorus: 3.3 mg/dL (ref 2.5–4.6)

## 2016-03-18 LAB — HEPARIN LEVEL (UNFRACTIONATED): Heparin Unfractionated: >2.2 IU/mL — ABNORMAL HIGH (ref 0.30–0.70)

## 2016-03-18 MED ORDER — ALLOPURINOL 300 MG PO TABS
300.0000 mg | ORAL_TABLET | Freq: Every day | ORAL | Status: DC
  Administered 2016-03-18 – 2016-03-23 (×6): 300 mg via ORAL
  Filled 2016-03-18 (×6): qty 1

## 2016-03-18 MED ORDER — FAMOTIDINE IN NACL 20-0.9 MG/50ML-% IV SOLN
20.0000 mg | Freq: Every day | INTRAVENOUS | Status: DC
  Administered 2016-03-18: 20 mg via INTRAVENOUS
  Filled 2016-03-18: qty 50

## 2016-03-18 MED ORDER — ORAL CARE MOUTH RINSE
15.0000 mL | Freq: Two times a day (BID) | OROMUCOSAL | Status: DC
  Administered 2016-03-20 – 2016-03-22 (×5): 15 mL via OROMUCOSAL

## 2016-03-18 MED ORDER — ASPIRIN EC 81 MG PO TBEC
81.0000 mg | DELAYED_RELEASE_TABLET | Freq: Every day | ORAL | Status: DC
  Administered 2016-03-19 – 2016-03-23 (×5): 81 mg via ORAL
  Filled 2016-03-18 (×5): qty 1

## 2016-03-18 MED ORDER — SODIUM CHLORIDE 0.9 % IV BOLUS (SEPSIS): 500.0000 mL | Freq: Once | INTRAVENOUS | Status: DC

## 2016-03-18 MED ORDER — SODIUM CHLORIDE 0.9 % IV SOLN
INTRAVENOUS | Status: DC
  Administered 2016-03-18 – 2016-03-19 (×2): via INTRAVENOUS

## 2016-03-18 MED ORDER — BUDESONIDE 0.5 MG/2ML IN SUSP
0.5000 mg | Freq: Two times a day (BID) | RESPIRATORY_TRACT | Status: DC
  Administered 2016-03-18 – 2016-03-23 (×10): 0.5 mg via RESPIRATORY_TRACT
  Filled 2016-03-18 (×12): qty 2

## 2016-03-18 MED ORDER — CEFEPIME HCL 1 G IJ SOLR
1.0000 g | Freq: Two times a day (BID) | INTRAMUSCULAR | Status: DC
  Administered 2016-03-18 – 2016-03-23 (×11): 1 g via INTRAVENOUS
  Filled 2016-03-18 (×13): qty 1

## 2016-03-18 MED ORDER — CLONAZEPAM 1 MG PO TABS
1.0000 mg | ORAL_TABLET | Freq: Three times a day (TID) | ORAL | Status: DC
  Administered 2016-03-18 – 2016-03-23 (×15): 1 mg via ORAL
  Filled 2016-03-18 (×15): qty 1

## 2016-03-18 MED ORDER — MIRTAZAPINE 30 MG PO TABS
45.0000 mg | ORAL_TABLET | Freq: Every day | ORAL | Status: DC
  Administered 2016-03-18 – 2016-03-22 (×5): 45 mg via ORAL
  Filled 2016-03-18 (×5): qty 1

## 2016-03-18 MED ORDER — ENSURE ENLIVE PO LIQD
237.0000 mL | Freq: Two times a day (BID) | ORAL | Status: DC
  Administered 2016-03-19 – 2016-03-23 (×8): 237 mL via ORAL

## 2016-03-18 MED ORDER — METOPROLOL TARTRATE 5 MG/5ML IV SOLN
INTRAVENOUS | Status: AC
  Filled 2016-03-18: qty 5

## 2016-03-18 MED ORDER — DILTIAZEM HCL 60 MG PO TABS
60.0000 mg | ORAL_TABLET | Freq: Four times a day (QID) | ORAL | Status: DC
  Administered 2016-03-18 – 2016-03-23 (×21): 60 mg via ORAL
  Filled 2016-03-18 (×23): qty 1

## 2016-03-18 MED ORDER — METOPROLOL TARTRATE 5 MG/5ML IV SOLN: 5.0000 mg | Freq: Four times a day (QID) | INTRAVENOUS | Status: DC | PRN

## 2016-03-18 MED ORDER — RIVAROXABAN 20 MG PO TABS
20.0000 mg | ORAL_TABLET | Freq: Every day | ORAL | Status: DC
  Administered 2016-03-19 – 2016-03-22 (×4): 20 mg via ORAL
  Filled 2016-03-18 (×5): qty 1

## 2016-03-18 MED ORDER — RIVAROXABAN 20 MG PO TABS
20.0000 mg | ORAL_TABLET | Freq: Once | ORAL | Status: AC
  Administered 2016-03-18: 20 mg via ORAL
  Filled 2016-03-18: qty 1

## 2016-03-18 MED ORDER — IPRATROPIUM-ALBUTEROL 0.5-2.5 (3) MG/3ML IN SOLN
3.0000 mL | Freq: Four times a day (QID) | RESPIRATORY_TRACT | Status: DC
  Administered 2016-03-18 – 2016-03-19 (×6): 3 mL via RESPIRATORY_TRACT
  Filled 2016-03-18 (×6): qty 3

## 2016-03-18 NOTE — Progress Notes (Addendum)
PULMONARY / CRITICAL CARE MEDICINE   Name: Ricardo Hendricks MRN: [id number] DOB: 1950-06-20    ADMISSION DATE:  03/17/2016 CONSULTATION DATE:  03/17/16  REFERRING MD:  Lynelle DoctorKnapp - EDP  CHIEF COMPLAINT:  Cough  HISTORY OF PRESENT ILLNESS:  Ricardo Hendricks is a 66 y.o. male with PMH as outlined below and who resides at nursing home. She [facility] to [facility] ED 03/17/16 with cough and congestion.  He had apparently been fine 2 - 3 days prior over the weekend.  When family went to check on him 02/20, they noted that he had rattling sound in his chest.  He had reportedly not had any fevers/chills/sweats, chest pain, N/V/D, abd pain.  In ED, he was found to be hypotensive and CXR was c/w PNA.  BP remained soft despite IVF resuscitation; therefore, pt was started on neosynephrine.  [facility] and [facility].  Per family, pt had recent admission this past winter for sepsis.  No notes in our system.  PAST MEDICAL HISTORY :  He  has a past medical history of A-fib (HCC); Bipolar 1 disorder (HCC); Gout; Hernia of testicle; Hyperlipemia; and Hypertension.  PAST SURGICAL HISTORY: He  has a past surgical history that includes Coronary artery bypass graft; Total hip arthroplasty; Knee arthroscopy; and Bilateral hemi shoulder arthroplasty.  No Known Allergies  No current facility-administered medications on file prior to encounter.    No current outpatient prescriptions on file prior to encounter.    FAMILY HISTORY:  His has no family status information on file.    SOCIAL HISTORY: He  reports that he has never smoked. He has never used smokeless tobacco. He reports that he does not drink alcohol or use drugs.  REVIEW OF SYSTEMS:   All negative; except for those that are bolded, which indicate positives.  Constitutional: weight loss, weight gain, night sweats, fevers, chills, fatigue, weakness.  HEENT: headaches, sore throat, sneezing, nasal  congestion, post nasal drip, difficulty swallowing, tooth/dental problems, visual complaints, visual changes, ear aches. Neuro: difficulty with speech, weakness, numbness, ataxia. CV:  chest pain, orthopnea, PND, swelling in lower extremities, dizziness, palpitations, syncope.  Resp: Still has mild cough with dyspnea GI: heartburn, indigestion, abdominal pain, nausea, vomiting, diarrhea, constipation, change in bowel habits, loss of appetite, hematemesis, melena, hematochezia.  GU: dysuria, change in color of urine, urgency or frequency, flank pain, hematuria. MSK: joint pain or swelling, decreased range of motion. Psych: change in mood or affect, depression, anxiety, suicidal ideations, homicidal ideations. Skin: rash, itching, bruising.   SUBJECTIVE:   Wean the Neo-Synephrine today morning Feels well with no significant complaints  VITAL SIGNS: BP 104/85   Pulse (!) 110   Temp 99.2 F (37.3 C) (Oral)   Resp (!) 27   Ht 5\' 11"  (1.803 m)   Wt 131 lb 6.3 oz (59.6 kg)   SpO2 92%   BMI 18.33 kg/m   HEMODYNAMICS:    VENTILATOR SETTINGS: FiO2 (%):  [55 %] 55 %  INTAKE / OUTPUT: I/O last 3 completed shifts: In: 3708.6 [I.V.:1558.6; IV Piggyback:2150] Out: -    PHYSICAL EXAMINATION: General: Resting in bed, no distress Neuro: No focal deficits HEENT: No thyromegaly, JVD Cardiovascular: Regular rate and rhythm, no murmurs rubs gallops  Lungs: Nonlabored breathing, clear Abdomen: Soft, positive bowel sounds  Musculoskeletal: Normal tone and bulk, no edema Skin: Bruises and lower extremities, no rash     LABS:  BMET  Recent Labs Lab 03/17/16 1529 03/18/16 0231  NA  140 139  K 4.3 5.1  CL 107 109  CO2 25 22  BUN 30* 29*  CREATININE 1.34* 1.08  GLUCOSE 75 70    Electrolytes  Recent Labs Lab 03/17/16 1529 03/18/16 0231  CALCIUM 9.2 8.3*  MG  --  1.7  PHOS  --  3.3    CBC  Recent Labs Lab 03/17/16 1529 03/18/16 0231  WBC 2.0* 8.7  HGB 13.5 11.1*   HCT 39.5 33.3*  PLT 200 177    Coag's  Recent Labs Lab 03/17/16 2204 03/18/16 0231 03/18/16 1004  APTT 52* 67* 35    Sepsis Markers  Recent Labs Lab 03/17/16 1818 03/17/16 2204 03/18/16 0231 03/18/16 1244  LATICACIDVEN 2.86* 2.6*  --  3.4*  PROCALCITON  --  21.22 25.89  --     ABG  Recent Labs Lab 03/17/16 2305  PHART 7.442  PCO2ART 32.0  PO2ART 62.2*    Liver Enzymes  Recent Labs Lab 03/17/16 1529  AST 43*  ALT 21  ALKPHOS 78  BILITOT 1.1  ALBUMIN 3.3*    Cardiac Enzymes  Recent Labs Lab 03/17/16 2204 03/18/16 1244  TROPONINI 0.51* 0.69*    Glucose  Recent Labs Lab 03/17/16 2041  GLUCAP 85    Imaging Dg Chest Port 1 View  Result Date: 03/18/2016 CLINICAL DATA:  66 year old male with respiratory failure. EXAM: PORTABLE CHEST 1 VIEW COMPARISON:  Chest radiograph dated 03/17/2016 FINDINGS: Confluent bilateral lower lung field airspace opacities appear slightly progressed compared to the prior study. There is hyperinflation of the lungs likely related to underlying COPD. No significant pleural effusion. No pneumothorax. Stable mild cardiomegaly. Median sternotomy wires and left atrial appendage occlusive device noted. No acute osseous pathology. IMPRESSION: Bilateral lower lobe pneumonia slightly progressed since the prior radiograph. Follow-up after treatment recommended to document resolution. Electronically Signed   By: Elgie Collard M.D.   On: 03/18/2016 05:12   STUDIES:  CXR 02/21- bilateral lower lobe infiltrates consistent with multilobar pneumonia. I have reviewed the images personally  CULTURES: Blood 02/20 >  Sputum 02/20 >  Flu PCR 02/20 > negative RVP 2/21 > Coronavirus HKU  ANTIBIOTICS: Vanc 02/20 >  Cefepime 02/20 >   SIGNIFICANT EVENTS: 02/20 > admit.  LINES/TUBES: None.  DISCUSSION: Nursing home resident admitted with septic shock secondary to coronavirus infection, multilobar pneumonia, HCAP.  ASSESSMENT /  PLAN:  PULMONARY A: HCAP. Acute hypoxic respiratory failure - due to above. Coronavirus infection P:   Continue antibiotics as above Wean down supplemental oxygen Follow chest x-ray  INFECTIOUS A:   HCAP. P:   Continue Vanco, cefepime Follow cultures and progressed on an  CARDIOVASCULAR A:  Hx HTN, HLD, A.fib (rivaroxaban). Elevated lactic acid Elevated troponins P:  Off pressors Transition heparin to outpatient Xarelto Start outpatient Cardizem. Use Lopressor PRN Continue to follow troponins and EKG Echo pending  RENAL A:   AKI. P:   Bolus 500cc NS. Increase NS to 100cc/hr  GASTROINTESTINAL A:   Nutrition. P:   Start PO feeds  HEMATOLOGIC A:   VTE Prophylaxis. P:  SCDs Follow CBC  ENDOCRINE A:   No acute issues.   P:    NEUROLOGIC A:   Acute anxiety P:   Restart clonazepam, mirtazapine  Family updated: Sisters updated at bedside 2/21.  Interdisciplinary Family Meeting v Palliative Care Meeting:  Due by: 03/23/16.  Critical care time-- 35 mins.   Chilton Greathouse MD [facility] [facility] Pager [phone] If no answer or after  3pm call: [phone] 03/18/2016, 4:15 PM

## 2016-03-18 NOTE — Care Management Note (Signed)
Case Management Note  Patient Details  Name: Ricardo Hendricks MRN: [id number] Date of Birth: 06-06-1950  Subjective/Objective:  Pt admitted with PNA              Action/Plan:  PTA from ALF.  CSW following   Expected Discharge Date:                  Expected Discharge Plan:  Assisted Living / Rest Home (From [facility])  In-House Referral:  Clinical Social Work  Discharge planning Services  CM Consult  Post Acute Care Choice:    Choice offered to:     DME Arranged:    DME Agency:     HH Arranged:    HH Agency:     Status of Service:  In process, will continue to follow  If discussed at Long Length of Stay Meetings, dates discussed:    Additional Comments:  Cherylann Parr, RN 03/18/2016, 12:38 PM

## 2016-03-18 NOTE — Progress Notes (Signed)
[facility]. This was confirmed by facility. Per records, Patient's sister is POA.   [facility] (admit 12/31/15-01/03/16), [facility]. At discharge, Patient was d[facility]. [facility] on 01/22/16. This was confirmed by facility.    Enos FlingAshley Napoleon Monacelli, MSW, LCSW [facility] ED/61M Clinical Social Worker [phone]

## 2016-03-18 NOTE — Progress Notes (Signed)
  Echocardiogram 2D Echocardiogram has been performed.  Leta JunglingCooper, Mahati Vajda M 03/18/2016, 10:43 AM

## 2016-03-18 NOTE — Progress Notes (Signed)
ANTICOAGULATION CONSULT NOTE - Follow-up Consult  Pharmacy Consult:  Heparin Indication: atrial fibrillation  No Known Allergies  Patient Measurements: Height: 5\' 11"  (180.3 cm) Weight: 131 lb 6.3 oz (59.6 kg) IBW/kg (Calculated) : 75.3 Heparin Dosing Weight: 60 kg  Vital Signs: Temp: 98.6 F (37 C) (02/21 0030) Temp Source: Oral (02/21 0030) BP: 99/63 (02/21 0245) Pulse Rate: 60 (02/21 0245)  Labs:  Recent Labs  03/17/16 1529 03/17/16 2204 03/18/16 0231  HGB 13.5  --  11.1*  HCT 39.5  --  33.3*  PLT 200  --  177  APTT  --  52* 67*  HEPARINUNFRC  --  >2.20*  --   CREATININE 1.34*  --   --   TROPONINI  --  0.51*  --     Estimated Creatinine Clearance: 45.7 mL/min (by C-G formula based on SCr of 1.34 mg/dL (H)).   Assessment: 89 YOM [facility]16 with cough and congestion.  Patient was hypotensive, requiring pressor and [facility].  Pharmacy consulted to initiate IV heparin for history of Afib while Xarelto is on hold. Heparin level >2.2 so Xarelto still affecting. Utilizing PTT to monitor heparin for now. PTT 67 sec (therapeutic) on gtt at 750 units/hr. Hgb and plt down. No bleeding noted.  Goal of Therapy:  Heparin level 0.3-0.7 units/ml aPTT 66 - 102 seconds Monitor platelets by anticoagulation protocol: Yes    Plan:  Continue heparin at 750 units/hr Will f/u 6hr PTT to confirm therapeutic  Christoper Fabianaron Bernardine Langworthy, PharmD, BCPS Clinical pharmacist, pager [phone] 03/18/2016, 3:51 AM

## 2016-03-18 NOTE — Progress Notes (Signed)
ANTICOAGULATION & ANTIBIOTIC CONSULT NOTE - Follow-up Consult  Pharmacy Consult for Change Heparin to Xarelto; Cefepime + Vancomycin Indication: atrial fibrillation; HCAP  No Known Allergies  Patient Measurements: Height: 5\' 11"  (180.3 cm) Weight: 131 lb 6.3 oz (59.6 kg) IBW/kg (Calculated) : 75.3 Heparin Dosing Weight: 60 kg  Vital Signs: Temp: 99.1 F (37.3 C) (02/21 1112) Temp Source: Oral (02/21 1112) BP: 99/70 (02/21 1200) Pulse Rate: 88 (02/21 1000)  Labs:  Recent Labs  03/17/16 1529 03/17/16 2204 03/18/16 0231 03/18/16 1004  HGB 13.5  --  11.1*  --   HCT 39.5  --  33.3*  --   PLT 200  --  177  --   APTT  --  52* 67* 35  HEPARINUNFRC  --  >2.20* >2.20*  --   CREATININE 1.34*  --  1.08  --   TROPONINI  --  0.51*  --   --     Estimated Creatinine Clearance: 56.7 mL/min (by C-G formula based on SCr of 1.08 mg/dL).   Medical History: Past Medical History:  Diagnosis Date  . A-fib (HCC)   . Bipolar 1 disorder (HCC)   . Gout   . Hernia of testicle   . Hyperlipemia   . Hypertension    Assessment: 66 year old male with history of atrial fibrillation on IV Heparin to change back to home Xarelto now that orals can be administered. CBC is stable. APTT wnl. No bleeding noted.   Patient is also on day #2 of Vancomycin and Cefepime for HCAP.  Renal function is improving- SCr 1.08 with estimated CrCl ~55 mL/min. Will increase Cefepime dosing.    Goal of Therapy:  Monitor platelets by anticoagulation protocol: Yes   Plan:  Discontinue IV Heparin. Start Xarelto 20mg  daily - first dose now, then with supper starting 2/22.  Monitor CBC and for signs and symptoms of bleeding.   Increase Cefepime to 1g IV every 12 hours.  Continue Vancomycin 500mg  IV every 12 hours.   Link SnufferJessica Myrakle Wingler, PharmD, BCPS Clinical Pharmacist Clinical phone 03/18/2016 until 3:30 PM- [phone] After hours, please call [phone] 03/18/2016,12:57 PM

## 2016-03-19 DIAGNOSIS — E43 Unspecified severe protein-calorie malnutrition: Secondary | ICD-10-CM | POA: Insufficient documentation

## 2016-03-19 LAB — BASIC METABOLIC PANEL
Anion gap: 5 (ref 5–15)
BUN: 22 mg/dL — ABNORMAL HIGH (ref 6–20)
CALCIUM: 8.2 mg/dL — AB (ref 8.9–10.3)
CHLORIDE: 114 mmol/L — AB (ref 101–111)
CO2: 22 mmol/L (ref 22–32)
CREATININE: 0.73 mg/dL (ref 0.61–1.24)
GFR calc non Af Amer: >60 mL/min (ref >60)
GLUCOSE: 90 mg/dL (ref 65–99)
Potassium: 4.1 mmol/L (ref 3.5–5.1)
Sodium: 141 mmol/L (ref 135–145)

## 2016-03-19 LAB — CBC
HEMATOCRIT: 28.7 % — AB (ref 39.0–52.0)
HEMOGLOBIN: 9.7 g/dL — AB (ref 13.0–17.0)
MCH: 33 pg (ref 26.0–34.0)
MCHC: 33.8 g/dL (ref 30.0–36.0)
MCV: 97.6 fL (ref 78.0–100.0)
Platelets: 125 10*3/uL — ABNORMAL LOW (ref 150–400)
RBC: 2.94 MIL/uL — ABNORMAL LOW (ref 4.22–5.81)
RDW: 12.6 % (ref 11.5–15.5)
WBC: 10.2 10*3/uL (ref 4.0–10.5)

## 2016-03-19 LAB — PROCALCITONIN: Procalcitonin: 16.23 ng/mL

## 2016-03-19 LAB — BRAIN NATRIURETIC PEPTIDE: B NATRIURETIC PEPTIDE 5: 643 pg/mL — AB (ref 0.0–100.0)

## 2016-03-19 LAB — URINE CULTURE: CULTURE: NO GROWTH

## 2016-03-19 LAB — APTT: APTT: 51 s — AB (ref 24–36)

## 2016-03-19 LAB — TROPONIN I
TROPONIN I: 0.5 ng/mL — AB (ref <0.03)
TROPONIN I: 0.62 ng/mL — AB (ref <0.03)

## 2016-03-19 LAB — LACTIC ACID, PLASMA: Lactic Acid, Venous: 1.2 mmol/L (ref 0.5–1.9)

## 2016-03-19 MED ORDER — IPRATROPIUM-ALBUTEROL 0.5-2.5 (3) MG/3ML IN SOLN
3.0000 mL | Freq: Four times a day (QID) | RESPIRATORY_TRACT | Status: DC
  Administered 2016-03-19 – 2016-03-21 (×7): 3 mL via RESPIRATORY_TRACT
  Filled 2016-03-19 (×9): qty 3

## 2016-03-19 MED ORDER — DEXTROSE-NACL 5-0.45 % IV SOLN
INTRAVENOUS | Status: DC
  Administered 2016-03-19 – 2016-03-23 (×4): via INTRAVENOUS

## 2016-03-19 NOTE — Progress Notes (Signed)
NURSING PROGRESS NOTE  Ricardo MayWilliam Hendricks [id number] Transfer Data: 03/19/2016 3:47 PM Attending Provider: Oretha Milchakesh Alva V, MD PCP:No primary care provider on file. Code Status: Full  Ricardo MayWilliam Hendricks is a 66 y.o. male patient transferred from 45M -No acute distress noted.  -No complaints of shortness of breath.  -No complaints of chest pain.   Cardiac Monitoring: Box # 01 Afib  Blood pressure (!) 141/84, pulse 95, temperature 98 F (36.7 C), temperature source Oral, resp. rate 18, height 5\' 11"  (1.803 m), weight 59.6 kg (131 lb 6.3 oz), SpO2 93 %.    Allergies:  Patient has no known allergies.  Past Medical History:   has a past medical history of A-fib (HCC); Bipolar 1 disorder (HCC); Gout; Hernia of testicle; Hyperlipemia; and Hypertension.  Past Surgical History:   has a past surgical history that includes Coronary artery bypass graft; Total hip arthroplasty; Knee arthroscopy; and Bilateral hemi shoulder arthroplasty.  Social History:   reports that he has never smoked. He has never used smokeless tobacco. He reports that he does not drink alcohol or use drugs.  Skin: unstageable injury to right ankle.   Patient/Family orientated to room. Information packet given to patient/family. Admission inpatient armband information verified with patient/family to include name and date of birth and placed on patient arm. Side rails up x 2, fall assessment and education completed with patient/family. Patient/family able to verbalize understanding of risk associated with falls and verbalized understanding to call for assistance before getting out of bed. Call light within reach. Patient/family able to voice and demonstrate understanding of unit orientation instructions.    Will continue to evaluate and treat per MD orders.

## 2016-03-19 NOTE — Progress Notes (Signed)
[place] to [employer]

## 2016-03-19 NOTE — Progress Notes (Signed)
PULMONARY / CRITICAL CARE MEDICINE   Name: Ricardo Hendricks MRN: [id number] DOB: September 21, 1950    ADMISSION DATE:  03/17/2016 CONSULTATION DATE:  03/17/16  REFERRING MD:  Lynelle Doctor - EDP  CHIEF COMPLAINT:  Cough  Brief:  Ricardo Hendricks is a 66 y.o. male with PMH of AFiB, Bipolar, Gout, Hyperlipemia, and HTN who resides at nursing home. Presented to ED 02/20 with cough and congestion. In ED, he was found to be hypotensive and CXR was c/w PNA. BP remained soft despite IVF resuscitation; therefore, pt was started on neosynephrine.  He was subsequently transferred to [facility] for further evaluation and management.  SUBJECTIVE:   No events overnight. This AM when eating breakfast, patient felt like he was having difficulty swallowing, speech eval ordered.   VITAL SIGNS: BP 114/77   Pulse 89   Temp 97.7 F (36.5 C) (Oral)   Resp (!) 25   Ht 5\' 11"  (1.803 m)   Wt 59.6 kg (131 lb 6.3 oz)   SpO2 93%   BMI 18.33 kg/m   HEMODYNAMICS:    VENTILATOR SETTINGS:    INTAKE / OUTPUT: I/O last 3 completed shifts: In: 5450.8 [P.O.:1080; I.V.:3920.8; IV Piggyback:450] Out: 725 [Urine:725]   PHYSICAL EXAMINATION: General: Adult male, no distress  Neuro: Alert, oriented, follows commands  HEENT: normocephalic  Cardiovascular: Afib - Tachy, NI S1/S2, no MRG  Lungs: Diminished breath sounds to bases, no wheezing  Abdomen: non-distended, non-tender, active bowel sounds  Musculoskeletal: no acute  Skin: warm, dry, intact      LABS:  BMET  Recent Labs Lab 03/17/16 1529 03/18/16 0231  NA 140 139  K 4.3 5.1  CL 107 109  CO2 25 22  BUN 30* 29*  CREATININE 1.34* 1.08  GLUCOSE 75 70    Electrolytes  Recent Labs Lab 03/17/16 1529 03/18/16 0231  CALCIUM 9.2 8.3*  MG  --  1.7  PHOS  --  3.3    CBC  Recent Labs Lab 03/17/16 1529 03/18/16 0231 03/19/16 0050  WBC 2.0* 8.7 10.2  HGB 13.5 11.1* 9.7*  HCT 39.5 33.3* 28.7*  PLT 200 177 125*    Coag's  Recent Labs Lab  03/18/16 0231 03/18/16 1004 03/19/16 0050  APTT 67* 35 51*    Sepsis Markers  Recent Labs Lab 03/17/16 1818 03/17/16 2204 03/18/16 0231 03/18/16 1244 03/19/16 0050  LATICACIDVEN 2.86* 2.6*  --  3.4*  --   PROCALCITON  --  21.22 25.89  --  16.23    ABG  Recent Labs Lab 03/17/16 2305  PHART 7.442  PCO2ART 32.0  PO2ART 62.2*    Liver Enzymes  Recent Labs Lab 03/17/16 1529  AST 43*  ALT 21  ALKPHOS 78  BILITOT 1.1  ALBUMIN 3.3*    Cardiac Enzymes  Recent Labs Lab 03/18/16 1244 03/18/16 1808 03/19/16 0050  TROPONINI 0.69* 0.56* 0.62*    Glucose  Recent Labs Lab 03/17/16 2041  GLUCAP 85    Imaging No results found.  STUDIES:  CXR 02/21- bilateral lower lobe infiltrates consistent with multilobar pneumonia. I have reviewed the images personally ECHO 2/21 > 60-65%, mild LVH, normal systolic function   CULTURES: Blood 02/20 >  Sputum 02/20 >  Flu PCR 02/20 > negative MRSA PCR 2/20 > Neg RVP 2/21 > Coronavirus HKU  ANTIBIOTICS: Vanc 02/20 >  Cefepime 02/20 >   SIGNIFICANT EVENTS: 02/20 > admit.  LINES/TUBES: None.  DISCUSSION: Nursing home resident admitted with septic shock secondary to coronavirus infection, multilobar pneumonia, HCAP.  ASSESSMENT /  PLAN:  PULMONARY A: HCAP. Acute hypoxic respiratory failure - due to above. Coronavirus infection P:   Continue antibiotics as below  Pulmicort BID  Wean supplemental oxygen Follow chest x-ray  INFECTIOUS A:   HCAP. +Coronavirus  P:   Trend WBC and Fever curve  Continue Vanco, cefepime Follow cultures data   CARDIOVASCULAR A:  Hx HTN, HLD, A.fib (rivaroxaban). Elevated lactic acid Elevated troponins P:  Continue Xarelto and ASA Continue Cardizem.  Lopressor PRN Trend Trop and lactic acid   RENAL A:   AKI. - Improving  P:   NS 100cc/hr  GASTROINTESTINAL A:   Dysphagia, ?Aspiration Event  P:   Speech Eval NPO  HEMATOLOGIC A:   VTE Prophylaxis. P:   SCDs Follow CBC  ENDOCRINE A:   No acute issues.   P:   Trend Glucose on BMP  NEUROLOGIC A:   Acute anxiety P:   Continue clonazepam, mirtazapine  Family updated: Sisters updated at bedside 2/21.  Interdisciplinary Family Meeting v Palliative Care Meeting:  Due by: 03/23/16.  Transfer to Telemetry, [facility] to take over care on 2/23.   Jovita KussmaulKatalina Ricardo Hendricks, AG-ACNP [facility] [facility]  Pgr: [phone]  PCCM Pgr: [phone]

## 2016-03-19 NOTE — Progress Notes (Signed)
Initial Nutrition Assessment  DOCUMENTATION CODES:   Severe malnutrition in context of acute illness/injury, Underweight  INTERVENTION:  - Diet advancement as able per SLP after swallow evaluation  - Resume Ensure Enlive po BID when diet advances  NUTRITION DIAGNOSIS:   Malnutrition related to acute illness as evidenced by severe depletion of muscle mass, severe depletion of body fat.  GOAL:   Patient will meet greater than or equal to 90% of their needs  MONITOR:   PO intake, Supplement acceptance, Weight trends, I & O's, Skin  REASON FOR ASSESSMENT:   Malnutrition Screening Tool    ASSESSMENT:   66 y.o. male nursing home resident with PMH of AFIB, bipolar disorder, gout, hernia of testicle, hyperlipidemia, and HTN admitted with septic shock secondary to coronavirus infection, multilobar pneumonia, HCAP.   Pt seemed confused at time of visit.   Pt reports no weight loss. Per chart review, there is no weight history available. Pt reports UBW of 132. Pt is currently 131.  Pt reports having a good appetite. Discussed pt with RN today. Per RN report pt enjoyed the chocolate Ensure provided to him this morning. Per RN report pt did not eat much of breakfast. Per chart review, pt consumed 25-75% of meals thus far.  Per RN report pt's diet was changed from Regular to NPO, she is concerned for aspiration and consulted Speech Therapist for a swallow evaluation.  Nutrition-Focused Physical Exam completed. Findings are moderate-severe fat depletion, moderate to severe muscle depletion and no edema.  Labs and Medications reviewed  Diet Order:   NPO  Skin:  Wound (see comment) (Unstageable Pressure Injury on ankle)  Last BM:  2/20  Height:   Ht Readings from Last 1 Encounters:  03/17/16 5\' 11"  (1.803 m)    Weight:   Wt Readings from Last 1 Encounters:  03/19/16 131 lb 6.3 oz (59.6 kg)    Ideal Body Weight:  78 kg  BMI:  Body mass index is 18.33 kg/m.  Estimated  Nutritional Needs:   Kcal:  1700-1900 (30-32 kcal/kg)  Protein:  90-100 grams (1.5-1.7 g/kg)  Fluid:  >/= 1.7 L/d  EDUCATION NEEDS:   Education needs no appropriate at this time   [employer] Ioannides Dietetic Intern

## 2016-03-19 NOTE — Evaluation (Signed)
Clinical/Bedside Swallow Evaluation Patient Details  Name: Ricardo Hendricks MRN: [id number] Date of Birth: 09/10/1950  Today's Date: 03/19/2016 Time: SLP Start Time (ACUTE ONLY): 1330 SLP Stop Time (ACUTE ONLY): 1342 SLP Time Calculation (min) (ACUTE ONLY): 12 min  Past Medical History:  Past Medical History:  Diagnosis Date  . A-fib (HCC)   . Bipolar 1 disorder (HCC)   . Gout   . Hernia of testicle   . Hyperlipemia   . Hypertension    Past Surgical History:  Past Surgical History:  Procedure Laterality Date  . BILATERAL HEMI SHOULDER ARTHROPLASTY    . CORONARY ARTERY BYPASS GRAFT    . KNEE ARTHROSCOPY    . TOTAL HIP ARTHROPLASTY     HPI:  66 year old nursing home resident with past medical history of Afib, bipolar disorder, gout, HTN, HLD admitted with septic shock secondary to coronavirus infection, multilobar pneumonia, HCAP.   Assessment / Plan / Recommendation Clinical Impression  Pt demonstrates no signs of aspiration, though mild dysphagia is possible. He reports he consumes a regular diet and thin liquids at baseline, denies any difficulty swallowing. RN reports significant choking episode with juice in the am while up in the chair. SLP observed a rapid rate of intake, audible swallows and 1-2 second swallows with liquids, which may be an effective independent strategy to clear mild residuals. Recommend pt resume a regular diet and thin liquids, but will f/u to observe with a meal. Reinforced basic aspiration precautions and emphasized slow rate of intake.  SLP Visit Diagnosis: Dysphagia, oropharyngeal phase (R13.12)    Aspiration Risk  Mild aspiration risk    Diet Recommendation Regular;Thin liquid   Liquid Administration via: Cup;Straw Medication Administration: Whole meds with liquid Supervision: Patient able to self feed Compensations: Slow rate;Small sips/bites Postural Changes: Seated upright at 90 degrees    Other  Recommendations Oral Care Recommendations:  Oral care BID   Follow up Recommendations 24 hour supervision/assistance      Frequency and Duration min 2x/week  2 weeks       Prognosis Prognosis for Safe Diet Advancement: Good      Swallow Study   General HPI: 66 year old nursing home resident with past medical history of Afib, bipolar disorder, gout, HTN, HLD admitted with septic shock secondary to coronavirus infection, multilobar pneumonia, HCAP. Type of Study: Bedside Swallow Evaluation Diet Prior to this Study: NPO Temperature Spikes Noted: No Respiratory Status: Nasal cannula History of Recent Intubation: No Behavior/Cognition: Alert;Cooperative Oral Cavity Assessment: Within Functional Limits Oral Care Completed by SLP: No Oral Cavity - Dentition: Adequate natural dentition Self-Feeding Abilities: Able to feed self Patient Positioning: Upright in bed Baseline Vocal Quality: Normal Volitional Cough: Strong Volitional Swallow: Able to elicit    Oral/Motor/Sensory Function Overall Oral Motor/Sensory Function: Within functional limits   Ice Chips     Thin Liquid Thin Liquid: Impaired Presentation: Cup;Straw Pharyngeal  Phase Impairments: Multiple swallows    Nectar Thick Nectar Thick Liquid: Not tested   Honey Thick Honey Thick Liquid: Not tested   Puree Puree: Within functional limits Presentation: Self Fed;Spoon   Solid   GO   Solid: Within functional limits Presentation: Self Floyce StakesFed       Kristia Jupiter, MA CCC-SLP [phone]  Synthia Fairbank, Riley NearingBonnie Caroline 03/19/2016,1:49 PM

## 2016-03-19 NOTE — Progress Notes (Signed)
Pt transported to 5W via wheelchair on tele monitor and on 3L [place]. Receiving RN at bedside with NT to place pt on 5W monitor. Pt stable. Stood and pivoted to bed. HR 92. Attempted to call POA to inform of transfer. Will try again.

## 2016-03-20 DIAGNOSIS — R748 Abnormal levels of other serum enzymes: Secondary | ICD-10-CM

## 2016-03-20 DIAGNOSIS — R6521 Severe sepsis with septic shock: Secondary | ICD-10-CM

## 2016-03-20 LAB — MAGNESIUM: Magnesium: 1.8 mg/dL (ref 1.7–2.4)

## 2016-03-20 LAB — BASIC METABOLIC PANEL
ANION GAP: 7 (ref 5–15)
BUN: 14 mg/dL (ref 6–20)
CO2: 19 mmol/L — AB (ref 22–32)
Calcium: 8 mg/dL — ABNORMAL LOW (ref 8.9–10.3)
Chloride: 113 mmol/L — ABNORMAL HIGH (ref 101–111)
Creatinine, Ser: 0.54 mg/dL — ABNORMAL LOW (ref 0.61–1.24)
GFR calc Af Amer: >60 mL/min (ref >60)
GFR calc non Af Amer: >60 mL/min (ref >60)
GLUCOSE: 96 mg/dL (ref 65–99)
POTASSIUM: 3.5 mmol/L (ref 3.5–5.1)
Sodium: 139 mmol/L (ref 135–145)

## 2016-03-20 LAB — CBC
HEMATOCRIT: 33.6 % — AB (ref 39.0–52.0)
Hemoglobin: 11.1 g/dL — ABNORMAL LOW (ref 13.0–17.0)
MCH: 32.2 pg (ref 26.0–34.0)
MCHC: 33 g/dL (ref 30.0–36.0)
MCV: 97.4 fL (ref 78.0–100.0)
PLATELETS: 120 10*3/uL — AB (ref 150–400)
RBC: 3.45 MIL/uL — AB (ref 4.22–5.81)
RDW: 12.5 % (ref 11.5–15.5)
WBC: 12.6 10*3/uL — AB (ref 4.0–10.5)

## 2016-03-20 LAB — PHOSPHORUS: Phosphorus: 2.5 mg/dL (ref 2.5–4.6)

## 2016-03-20 LAB — LEGIONELLA PNEUMOPHILA SEROGP 1 UR AG: L. PNEUMOPHILA SEROGP 1 UR AG: NEGATIVE

## 2016-03-20 MED ORDER — POLYETHYLENE GLYCOL 3350 17 G PO PACK
17.0000 g | PACK | Freq: Every day | ORAL | Status: DC
  Administered 2016-03-20 – 2016-03-23 (×4): 17 g via ORAL
  Filled 2016-03-20 (×4): qty 1

## 2016-03-20 NOTE — Progress Notes (Signed)
CCMD called, pt had 6 beats PVCs, VS stable, pt denied chest pain and discomfort. MD notified.  No new orders at this time

## 2016-03-20 NOTE — Progress Notes (Signed)
PROGRESS NOTE    Ricardo Hendricks  ZOX:[id number] DOB: [birth year] DOA: 03/17/2016 PCP: No primary care provider on file.   Chief Complaint  Patient presents with  . Cough    Brief Narrative:  HPI on 03/17/2016 by Dr. Girtha Rm (PCCM) Geordie Nooney is a 66 y.o. male with PMH as outlined below and who resides at nursing home. She presented to [facility] ED 03/17/16 with cough and congestion.  He had apparently been fine 2 - 3 days prior over the weekend.  When family went to check on him 02/20, they noted that he had rattling sound in his chest.  He had reportedly not had any fevers/chills/sweats, chest pain, N/V/D, abd pain.  In ED, he was found to be hypotensive and CXR was c/w PNA.  BP remained soft despite IVF resuscitation; therefore, pt was started on neosynephrine.  He was subsequently transferred to [facility] for further evaluation and management.  Per family, pt had recent admission this past winter for sepsis.  No notes in our system. Assessment & Plan   Acute hypoxic respiratory failure secondary to coronavirus pneumonia -Upon admission, SpO2 70-72% on room air -Chest x-ray showed bilateral lobe pneumonia -Influenza PCR negative, respiratory viral panel positive coronavirus HKU1 -Currently on supplemental oxygen, will continue to wean if possible. Patient currently needing 3-6 L -Continue vancomycin and cefepime -Continue pulmicort, nebs  Septic shock secondary to coronavirus pneumonia -Sepsis etiology appears to have improved. Patient did require Neo-Synephrine as his BP did not respond to IV fluids. -Patient did have elevated lactic acid which is now normalized -Blood cultures show no growth to date -Urine culture shows no growth  Elevated troponin -Suspect due to septic shock -Denies chest pain -Echocardiogram EF 60-65%, basal inferior hypokinesis, indeterminate diastolic function -BNP elevated 643 -Will speak to cardiology regarding these findings.  -Continue statin and  aspirin   Atrial fibrillation -Continue aspirin and Xarelto, Cardizem  Acute kidney injury -Upon admission, creatinine 1.34 -Given IV fluids, creatinine currently 0.54 -Continue to monitor  Dysphasia -Speech evaluation recommended in liquid, regular diet  Acute anxiety/Bipolar disorder -Continue clonazepam and mirtazapine  Constipation -Will order miralax  DVT Prophylaxis  xarelto  Code Status: Full  Family Communication: None at bedside  Disposition Plan: Admitted. Discharge to SNF when medically stable and respiratory status has improved  Consultants PCCM Cardiology via phone  Procedures  Echocardiogram  Antibiotics   Anti-infectives    Start     Dose/Rate Route Frequency Ordered Stop   03/18/16 1600  ceFEPIme (MAXIPIME) 1 g in dextrose 5 % 50 mL IVPB  Status:  Discontinued     1 g 100 mL/hr over 30 Minutes Intravenous Every 24 hours 03/17/16 1618 03/18/16 1022   03/18/16 1030  ceFEPIme (MAXIPIME) 1 g in dextrose 5 % 50 mL IVPB     1 g 100 mL/hr over 30 Minutes Intravenous Every 12 hours 03/18/16 1022     03/18/16 0500  vancomycin (VANCOCIN) 500 mg in sodium chloride 0.9 % 100 mL IVPB  Status:  Discontinued     500 mg 100 mL/hr over 60 Minutes Intravenous Every 12 hours 03/17/16 1618 03/19/16 1248   03/17/16 2230  oseltamivir (TAMIFLU) capsule 30 mg  Status:  Discontinued    Comments:  pls dose tamiflu. Awaiting influenza pcr   30 mg Oral 2 times daily 03/17/16 2225 03/18/16 1219   03/17/16 1541  ceFEPIme (MAXIPIME) 2 g injection    Comments:  Peel, Adrienne   : cabinet override  03/17/16 1541 03/18/16 0344   03/17/16 1515  ceFEPIme (MAXIPIME) 2 g in dextrose 5 % 50 mL IVPB     2 g 100 mL/hr over 30 Minutes Intravenous  Once 03/17/16 1513 03/17/16 1644   03/17/16 1515  vancomycin (VANCOCIN) IVPB 1000 mg/200 mL premix     1,000 mg 200 mL/hr over 60 Minutes Intravenous  Once 03/17/16 1513 03/17/16 1745      Subjective:   Reita MayWilliam Wesch seen and  examined today.  Patient states his breathing has mildly improved and that he does not use oxygen at home.  Has occasional dry cough. Currently denies chest pain, abdominal pain, nausea or vomiting, diarrhea. Patient does feel constipated and has not had a bowel movement in several days.  Objective:   Vitals:   03/20/16 0252 03/20/16 0300 03/20/16 0403 03/20/16 0554  BP:   124/82 134/89  Pulse:   89 81  Resp:   18 18  Temp:   98.2 F (36.8 C) 98.2 F (36.8 C)  TempSrc:   Oral Oral  SpO2: 91%  93% 95%  Weight:  66.4 kg (146 lb 6.2 oz)    Height:        Intake/Output Summary (Last 24 hours) at 03/20/16 1300 Last data filed at 03/20/16 0844  Gross per 24 hour  Intake             1175 ml  Output             1050 ml  Net              125 ml   Filed Weights   03/19/16 0500 03/19/16 1536 03/20/16 0300  Weight: 59.6 kg (131 lb 6.3 oz) 67.9 kg (149 lb 11.1 oz) 66.4 kg (146 lb 6.2 oz)    Exam  General: Well developed, well nourished, NAD, appears stated age  HEENT: NCAT, mucous membranes moist.   Neck: Supple, no JVD, no masses  Cardiovascular: S1 S2 auscultated, irregular, no murmurs  Respiratory: Diminished breath sounds  Abdomen: Soft, nontender, nondistended, + bowel sounds  Extremities: warm dry without cyanosis clubbing or edema  Neuro: AAOx3, nonfocal  Psych: Normal affect and demeanor    Data Reviewed: I have personally reviewed following labs and imaging studies  CBC:  Recent Labs Lab 03/17/16 1529 03/18/16 0231 03/19/16 0050 03/20/16 0531  WBC 2.0* 8.7 10.2 12.6*  NEUTROABS 1.7  --   --   --   HGB 13.5 11.1* 9.7* 11.1*  HCT 39.5 33.3* 28.7* 33.6*  MCV 97.8 98.2 97.6 97.4  PLT 200 177 125* 120*   Basic Metabolic Panel:  Recent Labs Lab 03/17/16 1529 03/18/16 0231 03/19/16 1233 03/20/16 0531  NA 140 139 141 139  K 4.3 5.1 4.1 3.5  CL 107 109 114* 113*  CO2 25 22 22  19*  GLUCOSE 75 70 90 96  BUN 30* 29* 22* 14  CREATININE 1.34* 1.08 0.73  0.54*  CALCIUM 9.2 8.3* 8.2* 8.0*  MG  --  1.7  --  1.8  PHOS  --  3.3  --  2.5   GFR: Estimated Creatinine Clearance: 85.3 mL/min (by C-G formula based on SCr of 0.54 mg/dL (L)). Liver Function Tests:  Recent Labs Lab 03/17/16 1529  AST 43*  ALT 21  ALKPHOS 78  BILITOT 1.1  PROT 6.2*  ALBUMIN 3.3*   No results for input(s): LIPASE, AMYLASE in the last 168 hours. No results for input(s): AMMONIA in the last 168 hours. Coagulation Profile:  No results for input(s): INR, PROTIME in the last 168 hours. Cardiac Enzymes:  Recent Labs Lab 03/17/16 2204 03/18/16 1244 03/18/16 1808 03/19/16 0050 03/19/16 1233  TROPONINI 0.51* 0.69* 0.56* 0.62* 0.50*   BNP (last 3 results) No results for input(s): PROBNP in the last 8760 hours. HbA1C: No results for input(s): HGBA1C in the last 72 hours. CBG:  Recent Labs Lab 03/17/16 2041  GLUCAP 85   Lipid Profile: No results for input(s): CHOL, HDL, LDLCALC, TRIG, CHOLHDL, LDLDIRECT in the last 72 hours. Thyroid Function Tests: No results for input(s): TSH, T4TOTAL, FREET4, T3FREE, THYROIDAB in the last 72 hours. Anemia Panel: No results for input(s): VITAMINB12, FOLATE, FERRITIN, TIBC, IRON, RETICCTPCT in the last 72 hours. Urine analysis:    Component Value Date/Time   COLORURINE AMBER (A) 03/18/2016 0505   APPEARANCEUR HAZY (A) 03/18/2016 0505   LABSPEC 1.021 03/18/2016 0505   PHURINE 5.0 03/18/2016 0505   GLUCOSEU NEGATIVE 03/18/2016 0505   HGBUR MODERATE (A) 03/18/2016 0505   BILIRUBINUR NEGATIVE 03/18/2016 0505   KETONESUR NEGATIVE 03/18/2016 0505   PROTEINUR NEGATIVE 03/18/2016 0505   NITRITE NEGATIVE 03/18/2016 0505   LEUKOCYTESUR NEGATIVE 03/18/2016 0505   Sepsis Labs: @LABRCNTIP (procalcitonin:4,lacticidven:4)  ) Recent Results (from the past 240 hour(s))  Blood Culture (routine x 2)     Status: None (Preliminary result)   Collection Time: 03/17/16  3:30 PM  Result Value Ref Range Status   Specimen  Description BLOOD RIGHT FEMORAL ARTERY  Final   Special Requests   Final    BOTTLES DRAWN AEROBIC AND ANAEROBIC 5CC BOTH BOTTLES   Culture   Final    NO GROWTH 3 DAYS Performed at [facility], [address], [place], [place] [address]    Report Status PENDING  Incomplete  Blood Culture (routine x 2)     Status: None (Preliminary result)   Collection Time: 03/17/16  3:45 PM  Result Value Ref Range Status   Specimen Description BLOOD left FA  Final   Special Requests BOTTLES DRAWN AEROBIC AND ANAEROBIC EACH  Final   Culture   Final    NO GROWTH 3 DAYS Performed at [facility], [address], [place], [place] [address]    Report Status PENDING  Incomplete  MRSA PCR Screening     Status: None   Collection Time: 03/17/16  9:02 PM  Result Value Ref Range Status   MRSA by PCR NEGATIVE NEGATIVE Final    Comment:        The GeneXpert MRSA Assay (FDA approved for NASAL specimens only), is one component of a comprehensive MRSA colonization surveillance program. It is not intended to diagnose MRSA infection nor to guide or monitor treatment for MRSA infections.   Respiratory Panel by PCR     Status: Abnormal   Collection Time: 03/18/16 12:02 AM  Result Value Ref Range Status   Adenovirus NOT DETECTED NOT DETECTED Final   Coronavirus 229E NOT DETECTED NOT DETECTED Final   Coronavirus HKU1 DETECTED (A) NOT DETECTED Final   Coronavirus NL63 NOT DETECTED NOT DETECTED Final   Coronavirus OC43 NOT DETECTED NOT DETECTED Final   Metapneumovirus NOT DETECTED NOT DETECTED Final   Rhinovirus / Enterovirus NOT DETECTED NOT DETECTED Final   Influenza A NOT DETECTED NOT DETECTED Final   Influenza B NOT DETECTED NOT DETECTED Final   Parainfluenza Virus 1 NOT DETECTED NOT DETECTED Final   Parainfluenza Virus 2 NOT DETECTED NOT DETECTED Final   Parainfluenza Virus 3  NOT DETECTED NOT DETECTED Final   Parainfluenza Virus 4 NOT DETECTED NOT DETECTED Final   Respiratory Syncytial  Virus NOT DETECTED NOT DETECTED Final   Bordetella pertussis NOT DETECTED NOT DETECTED Final   Chlamydophila pneumoniae NOT DETECTED NOT DETECTED Final   Mycoplasma pneumoniae NOT DETECTED NOT DETECTED Final  Urine culture     Status: None   Collection Time: 03/18/16  5:05 AM  Result Value Ref Range Status   Specimen Description URINE, CATHETERIZED  Final   Special Requests NONE  Final   Culture NO GROWTH  Final   Report Status 03/19/2016 FINAL  Final      Radiology Studies: No results found.   Scheduled Meds: . allopurinol  300 mg Oral Daily  . aspirin EC  81 mg Oral Daily  . budesonide (PULMICORT) nebulizer solution  0.5 mg Nebulization BID  . ceFEPime (MAXIPIME) IV  1 g Intravenous Q12H  . clonazePAM  1 mg Oral TID  . diltiazem  60 mg Oral Q6H  . feeding supplement (ENSURE ENLIVE)  237 mL Oral BID BM  . ipratropium-albuterol  3 mL Nebulization Q6H  . mouth rinse  15 mL Mouth Rinse BID  . mirtazapine  45 mg Oral QHS  . polyethylene glycol  17 g Oral Daily  . pravastatin  40 mg Oral q1800  . rivaroxaban  20 mg Oral Q supper  . sodium chloride  500 mL Intravenous Once   Continuous Infusions: . dextrose 5 % and 0.45% NaCl 50 mL/hr at 03/19/16 1418     LOS: 3 days   Time Spent in minutes   30 minutes  Arbie Blankley D.O. on 03/20/2016 at 1:00 PM  Between 7am to 7pm - Pager - [phone]  After 7pm go to [email or web address] - password [email or web address]  And look for the night coverage person covering for me after hours  [facility] Office  [phone]

## 2016-03-20 NOTE — Clinical Social Work Note (Signed)
Clinical Social Work Assessment  Patient Details  Name: Ricardo Hendricks MRN: [id number] Date of Birth: [birth year]  Date of referral:  03/20/16               Reason for consult:  Discharge Planning                Permission sought to share information with:  Facility Medical sales representativeContact Representative, Family Supports Permission granted to share information::  Yes, Verbal Permission Granted  Name::        Agency::  [facility]  Relationship::     Contact Information:     Housing/Transportation Living arrangements for the past 2 months:  Assisted Living Facility Source of Information:  Patient Patient Interpreter Needed:  None Criminal Activity/Legal Involvement Pertinent to Current Situation/Hospitalization:  No - Comment as needed Significant Relationships:  Adult Children, Siblings Lives with:  Facility Resident Do you feel safe going back to the place where you live?  Yes Need for family participation in patient care:  No (Coment)  Care giving concerns:  CSW received consult regarding discharge planning. Patient [facility] and he would like to return there at discharge. CSW to continue to follow and assist with discharge planning needs.   Social Worker assessment / plan:  CSW spoke with patient concerning discharge plan.  Employment status:  Retired Database administratornsurance information:  Managed Medicare PT Recommendations:  Not assessed at this time Information / Referral to community resources:     Patient/Family's Response to care:  Patient reports that he would like to go back to his ALF rather then SNF.   Patient/Family's Understanding of and Emotional Response to Diagnosis, Current Treatment, and Prognosis:  Patient/family is realistic regarding therapy needs and expressed being hopeful for return to ALF. Patient expressed understanding of CSW role and discharge process. No questions/concerns about plan or treatment.    Emotional  Assessment Appearance:  Appears stated age Attitude/Demeanor/Rapport:  Other (Appropriate) Affect (typically observed):  Accepting, Appropriate Orientation:  Oriented to Situation, Oriented to Place, Oriented to Self, Oriented to  Time Alcohol / Substance use:  Not Applicable Psych involvement (Current and /or in the community):  No (Comment)  Discharge Needs  Concerns to be addressed:  Care Coordination Readmission within the last 30 days:  No Current discharge risk:  None Barriers to Discharge:  Continued Medical Work up   [employer] S Skylen Danielsen, LCSWA 03/20/2016, 3:03 PM

## 2016-03-20 NOTE — Progress Notes (Signed)
Pt had 5 beat run of V tach.  Notified MD on call Dr. Toniann FailKakrakandy .  MD ordered STAT BMP and magnesium level.  Will continue to monitor and notify as needed

## 2016-03-20 NOTE — Progress Notes (Signed)
  Speech Language Pathology Treatment: Dysphagia  Patient Details Name: Ricardo Hendricks MRN: [id number] DOB: [birth year] Today's Date: 03/20/2016 Time: [id number] SLP Time Calculation (min) (ACUTE ONLY): 11 min  Assessment / Plan / Recommendation Clinical Impression  Pt seen with lunch meal, initially said the food was inedible and did not try anything, but agreed to eat the sweet items on his tray. SLP repositioned him upright, but pt self fed, still with rapid rate. Despite this, no signs of aspiration seen with solids or liquids. Pt is unlikely to follow any additional precautions or change his behavior despite instruction. He is a picky eater and would not eat with any diet modification or restriction. Recommend pt continue current diet with intermittent supervision for basic precautions. No SLP f/u needed.   HPI HPI: 66 year old nursing home resident with past medical history of Afib, bipolar disorder, gout, HTN, HLD admitted with septic shock secondary to coronavirus infection, multilobar pneumonia, HCAP.      SLP Plan  Continue with current plan of care       Recommendations  Diet recommendations: Regular;Thin liquid Medication Administration: Whole meds with liquid Supervision: Patient able to self feed Compensations: Slow rate;Small sips/bites                Oral Care Recommendations: Oral care BID Follow up Recommendations: 24 hour supervision/assistance SLP Visit Diagnosis: Dysphagia, oropharyngeal phase (R13.12) Plan: Continue with current plan of care       GO               [facility] Medical CenterBonnie Jesselyn Rask, MA CCC-SLP [phone]  Claudine MoutonDeBlois, Ricardo Hendricks 03/20/2016, 1:59 PM

## 2016-03-20 NOTE — Progress Notes (Signed)
Pharmacy Antibiotic Note Ricardo MayWilliam Hendricks is a 66 y.o. male admitted on 03/17/2016 with pneumonia.  Currently on day 4 of Cefepime for treatment.   Plan: 1. Continue Cefepime 1 gram IV every 12 hours  2. Follow up on planned length of therapy   Height: 5\' 11"  (180.3 cm) Weight: 146 lb 6.2 oz (66.4 kg) IBW/kg (Calculated) : 75.3  Temp (24hrs), Avg:98.1 F (36.7 C), Min:97.7 F (36.5 C), Max:98.7 F (37.1 C)   Recent Labs Lab 03/17/16 1529 03/17/16 1537 03/17/16 1818 03/17/16 2204 03/18/16 0231 03/18/16 1244 03/19/16 0050 03/19/16 1233 03/20/16 0531  WBC 2.0*  --   --   --  8.7  --  10.2  --  12.6*  CREATININE 1.34*  --   --   --  1.08  --   --  0.73 0.54*  LATICACIDVEN  --  3.79* 2.86* 2.6*  --  3.4*  --  1.2  --     Estimated Creatinine Clearance: 85.3 mL/min (by C-G formula based on SCr of 0.54 mg/dL (L)).    No Known Allergies  Antimicrobials this admission:  2/20 Cefepime >> 2/20 Vanc >> 2/22   Microbiology results: 2/20 MRSA PCR: negative  2/20 BCx: ng<24h 2/21 UA: negative  2/21 UCx: no growth 2/21 RVP: Coronavirus  Thank you for allowing pharmacy to be a part of this patient's care.  Pollyann SamplesAndy Philis Doke, PharmD, BCPS 03/20/2016, 8:02 AM Pager: [phone]

## 2016-03-21 ENCOUNTER — Inpatient Hospital Stay (HOSPITAL_COMMUNITY): Payer: Medicare Other

## 2016-03-21 DIAGNOSIS — L8990 Pressure ulcer of unspecified site, unspecified stage: Secondary | ICD-10-CM

## 2016-03-21 DIAGNOSIS — E43 Unspecified severe protein-calorie malnutrition: Secondary | ICD-10-CM

## 2016-03-21 LAB — CBC
HEMATOCRIT: 33.3 % — AB (ref 39.0–52.0)
HEMOGLOBIN: 11 g/dL — AB (ref 13.0–17.0)
MCH: 31.5 pg (ref 26.0–34.0)
MCHC: 33 g/dL (ref 30.0–36.0)
MCV: 95.4 fL (ref 78.0–100.0)
Platelets: 149 10*3/uL — ABNORMAL LOW (ref 150–400)
RBC: 3.49 MIL/uL — ABNORMAL LOW (ref 4.22–5.81)
RDW: 12.4 % (ref 11.5–15.5)
WBC: 10.4 10*3/uL (ref 4.0–10.5)

## 2016-03-21 LAB — BASIC METABOLIC PANEL
ANION GAP: 12 (ref 5–15)
ANION GAP: 9 (ref 5–15)
BUN: 10 mg/dL (ref 6–20)
BUN: 7 mg/dL (ref 6–20)
CALCIUM: 7.9 mg/dL — AB (ref 8.9–10.3)
CALCIUM: 8.4 mg/dL — AB (ref 8.9–10.3)
CHLORIDE: 105 mmol/L (ref 101–111)
CHLORIDE: 108 mmol/L (ref 101–111)
CO2: 21 mmol/L — AB (ref 22–32)
CO2: 22 mmol/L (ref 22–32)
CREATININE: 0.56 mg/dL — AB (ref 0.61–1.24)
Creatinine, Ser: 0.54 mg/dL — ABNORMAL LOW (ref 0.61–1.24)
GFR calc non Af Amer: >60 mL/min (ref >60)
GFR calc non Af Amer: >60 mL/min (ref >60)
GLUCOSE: 84 mg/dL (ref 65–99)
GLUCOSE: 90 mg/dL (ref 65–99)
Potassium: 5 mmol/L (ref 3.5–5.1)
Potassium: 3.2 mmol/L — ABNORMAL LOW (ref 3.5–5.1)
Sodium: 139 mmol/L (ref 135–145)
Sodium: 138 mmol/L (ref 135–145)

## 2016-03-21 LAB — POTASSIUM: Potassium: 3.3 mmol/L — ABNORMAL LOW (ref 3.5–5.1)

## 2016-03-21 LAB — MAGNESIUM: Magnesium: 1.6 mg/dL — ABNORMAL LOW (ref 1.7–2.4)

## 2016-03-21 MED ORDER — SODIUM CHLORIDE 0.9 % IV BOLUS (SEPSIS)
500.0000 mL | Freq: Once | INTRAVENOUS | Status: AC
  Administered 2016-03-21: 500 mL via INTRAVENOUS

## 2016-03-21 MED ORDER — POTASSIUM CHLORIDE 20 MEQ/15ML (10%) PO SOLN: 40.0000 meq | Freq: Every day | ORAL | Status: DC

## 2016-03-21 MED ORDER — IPRATROPIUM-ALBUTEROL 0.5-2.5 (3) MG/3ML IN SOLN: 3.0000 mL | Freq: Four times a day (QID) | RESPIRATORY_TRACT | Status: DC | PRN

## 2016-03-21 MED ORDER — POTASSIUM CHLORIDE 20 MEQ/15ML (10%) PO SOLN
40.0000 meq | Freq: Once | ORAL | Status: AC
  Administered 2016-03-21: 40 meq via ORAL
  Filled 2016-03-21: qty 30

## 2016-03-21 MED ORDER — ACETAMINOPHEN 325 MG PO TABS
650.0000 mg | ORAL_TABLET | ORAL | Status: DC | PRN
  Administered 2016-03-21 – 2016-03-22 (×2): 650 mg via ORAL
  Filled 2016-03-21 (×2): qty 2

## 2016-03-21 NOTE — Progress Notes (Signed)
CSW spoke with pt's sister. [facility] [place]. CSW sent the referral along with a referral [place] [facility] [place]. [facility] [phone] to follow up on referral. Admissions director was not available.   Jonathon JordanLynn B Marlissa Emerick, MSW, LCSWA Covering [phone]

## 2016-03-21 NOTE — Evaluation (Signed)
Physical Therapy Evaluation Patient Details Name: Ricardo MayWilliam Kreitz MRN: [id number] DOB: 1950/11/15 Today's Date: 03/21/2016   History of Present Illness  Patient is a 66 yo male admitted 03/17/16 with cough and congestion.  Patient with sepsis due to pna, acute resp failure, hypotension, and fall.    PMH:  mult falls, Afib, anxiety, bipolar disorder, HLD, HTN, gout  Clinical Impression  Patient presents with problems listed below.  Will benefit from acute PT to maximize functional mobility prior to discharge.  Patient requiring mod assist for mobility, and needs +2 assist for gait.  Recommend SNF at d/c for continued therapy.    Follow Up Recommendations SNF;Supervision/Assistance - 24 hour    Equipment Recommendations  Rolling walker with 5" wheels    Recommendations for Other Services       Precautions / Restrictions Precautions Precautions: Fall Precaution Comments: mult falls pta Restrictions Weight Bearing Restrictions: No      Mobility  Bed Mobility Overal bed mobility: Needs Assistance Bed Mobility: Supine to Sit;Sit to Supine     Supine to sit: Mod assist;HOB elevated Sit to supine: Mod assist   General bed mobility comments: Verbal cues for technique.  Assist to bring trunk to sitting position.  Patient able to maintain sitting balance with min guard assist.  Assist to bring LE's onto bed to return to supine.  Transfers Overall transfer level: Needs assistance Equipment used: 1 person hand held assist Transfers: Sit to/from Stand Sit to Stand: Min assist         General transfer comment: Assist for balance/safety during transfers and stance.  Ambulation/Gait Ambulation/Gait assistance: Mod assist Ambulation Distance (Feet): 6 Feet (3' forward and 3' backward) Assistive device: 1 person hand held assist Gait Pattern/deviations: Step-through pattern;Decreased step length - right;Decreased step length - left;Decreased stride length;Shuffle;Ataxic;Staggering  left;Staggering right;Trunk flexed;Narrow base of support Gait velocity: decreased Gait velocity interpretation: Below normal speed for age/gender General Gait Details: Patient with very unsteady gait, with ataxic movements of LE's.  Required mod assist to maintain standing balance.  Minimal distance due to decreased balance/safety.  Will need to use RW.  Stairs            Wheelchair Mobility    Modified Rankin (Stroke Patients Only)       Balance Overall balance assessment: Needs assistance;History of Falls Sitting-balance support: No upper extremity supported;Feet supported Sitting balance-Leahy Scale: Fair     Standing balance support: Single extremity supported Standing balance-Leahy Scale: Poor                               Pertinent Vitals/Pain Pain Assessment: No/denies pain    Home Living Family/patient expects to be discharged to:: Skilled nursing facility                      Prior Function Level of Independence: Needs assistance   Gait / Transfers Assistance Needed: Patient reports he is independent with ambulation and no assistive device.  ADL's / Homemaking Assistance Needed: Meals and housekeeping provided by facility.  Otherwise, unsure.  Comments: Unsure if information from patient is reliable.     Hand Dominance        Extremity/Trunk Assessment   Upper Extremity Assessment Upper Extremity Assessment: Generalized weakness (Decreased shoulder ROM bil.)    Lower Extremity Assessment Lower Extremity Assessment: Generalized weakness (Decreased coordination - ataxic movement)    Cervical / Trunk Assessment Cervical / Trunk Assessment: Kyphotic  Communication   Communication: No difficulties  Cognition Arousal/Alertness: Awake/alert Behavior During Therapy: Flat affect;Impulsive;Anxious Overall Cognitive Status: No family/caregiver present to determine baseline cognitive functioning                 General  Comments: Patient is oriented x3, but requiring long period of time to answer questions.  Very slow to respond.  Reports he is independent with ambulation with no assistive device, and then reports he is working with PT at facility on ambulation.    General Comments      Exercises     Assessment/Plan    PT Assessment Patient needs continued PT services  PT Problem List Decreased strength;Decreased range of motion;Decreased activity tolerance;Decreased balance;Decreased mobility;Decreased coordination;Decreased cognition;Decreased knowledge of use of DME;Decreased safety awareness       PT Treatment Interventions DME instruction;Gait training;Functional mobility training;Therapeutic activities;Therapeutic exercise;Balance training;Neuromuscular re-education;Cognitive remediation;Patient/family education    PT Goals (Current goals can be found in the Care Plan section)  Acute Rehab PT Goals Patient Stated Goal: To get better PT Goal Formulation: With patient Time For Goal Achievement: 04/04/16 Potential to Achieve Goals: Good    Frequency Min 3X/week   Barriers to discharge        Co-evaluation               End of Session Equipment Utilized During Treatment: Gait belt Activity Tolerance: Patient limited by fatigue Patient left: in bed;with call bell/phone within reach;with bed alarm set;with SCD's reapplied Nurse Communication: Mobility status (Question cognition. Recommend SNF.) PT Visit Diagnosis: Unsteadiness on feet (R26.81);Repeated falls (R29.6);Muscle weakness (generalized) (M62.81);Ataxic gait (R26.0)         Time: 1610-9604 PT Time Calculation (min) (ACUTE ONLY): 12 min   Charges:   PT Evaluation $PT Eval Moderate Complexity: 1 Procedure     PT G Codes:         Vena Austria 2016/04/14, 8:46 PM Durenda Hurt. Renaldo Fiddler, [facility] Acute Rehab Services Pager [phone]

## 2016-03-21 NOTE — Progress Notes (Addendum)
PROGRESS NOTE    Ricardo Hendricks  ZOX:[id number] DOB: June 28, 1950 DOA: 03/17/2016 PCP: No primary care provider on file.   Chief Complaint  Patient presents with  . Cough    Brief Narrative:  HPI on 03/17/2016 by Dr. Girtha Rm (PCCM) Ricardo Hendricks is a 66 y.o. male with PMH as outlined below and who resides at nursing home. She presented to [facility] ED 03/17/16 with cough and congestion.  He had apparently been fine 2 - 3 days prior over the weekend.  When family went to check on him 02/20, they noted that he had rattling sound in his chest.  He had reportedly not had any fevers/chills/sweats, chest pain, N/V/D, abd pain.  In ED, he was found to be hypotensive and CXR was c/w PNA.  BP remained soft despite IVF resuscitation; therefore, pt was started on neosynephrine.  He was subsequently transferred to [facility] for further evaluation and management.  Per family, pt had recent admission this past winter for sepsis.  No notes in our system. Assessment & Plan   Acute hypoxic respiratory failure secondary to coronavirus pneumonia -Upon admission, SpO2 70-72% on room air -Chest x-ray showed bilateral lobe pneumonia -Influenza PCR negative, respiratory viral panel positive coronavirus HKU1 -Currently on supplemental oxygen, will continue to wean if possible. Patient currently needing 3-6 L -Continue vancomycin and cefepime -Continue pulmicort, nebs -Will ask for O2 to be monitored at rest/ambulation  Septic shock secondary to coronavirus pneumonia -Sepsis etiology appears to have improved. Patient did require Neo-Synephrine as his BP did not respond to IV fluids. -Patient did have elevated lactic acid which is now normalized -Blood cultures show no growth to date -Urine culture shows no growth  Elevated troponin -Suspect due to septic shock -Denies chest pain -Echocardiogram EF 60-65%, basal inferior hypokinesis, indeterminate diastolic function -BNP elevated 643 -Continue statin and  aspirin  -Patient does have history of CAD and follows with cardiology in [facility] -Troponins have been flat  Fall -Patient did have fall today with small abrasion to his head -CT head ordered -Currently no new neuro deficits, no complaints of pain, headache, or dizziness -Spoke with patient's sister via phone, he has had several falls in the past.  Discussed Xarelto, she is aware of the risks of bleeding vs stroke and would like to continue Xarelto. Also discussed patient and his deconditioning, she agreed that patient likely needs SNF. -PT/OT consulted. -SW consulted  Atrial fibrillation -Continue aspirin and Xarelto, Cardizem  Acute kidney injury -Upon admission, creatinine 1.34 -Given IV fluids, creatinine currently 0.56 -Continue to monitor  Dysphasia/Severe malnutrition -Speech evaluation recommended in liquid, regular diet -nutrition consulted, continue supplements  Acute anxiety/Bipolar disorder -Continue clonazepam and mirtazapine  Constipation -Continue miralax  Hypokalemia/hypomagnesemia -Will replace and continue to monitor  Code status: Discussed with sister (POA) and she does have a DNR form.   DVT Prophylaxis  xarelto  Code Status: DNR  Family Communication: None at bedside. Sister via phone  Disposition Plan: Admitted. Discharge to SNF when medically stable and respiratory status has improved  Consultants PCCM Cardiology via phone  Procedures  Echocardiogram  Antibiotics   Anti-infectives    Start     Dose/Rate Route Frequency Ordered Stop   03/18/16 1600  ceFEPIme (MAXIPIME) 1 g in dextrose 5 % 50 mL IVPB  Status:  Discontinued     1 g 100 mL/hr over 30 Minutes Intravenous Every 24 hours 03/17/16 1618 03/18/16 1022   03/18/16 1030  ceFEPIme (MAXIPIME) 1 g in dextrose  5 % 50 mL IVPB     1 g 100 mL/hr over 30 Minutes Intravenous Every 12 hours 03/18/16 1022     03/18/16 0500  vancomycin (VANCOCIN) 500 mg in sodium chloride 0.9 % 100 mL IVPB   Status:  Discontinued     500 mg 100 mL/hr over 60 Minutes Intravenous Every 12 hours 03/17/16 1618 03/19/16 1248   03/17/16 2230  oseltamivir (TAMIFLU) capsule 30 mg  Status:  Discontinued    Comments:  pls dose tamiflu. Awaiting influenza pcr   30 mg Oral 2 times daily 03/17/16 2225 03/18/16 1219   03/17/16 1541  ceFEPIme (MAXIPIME) 2 g injection    Comments:  Peel, Adrienne   : cabinet override      03/17/16 1541 03/18/16 0344   03/17/16 1515  ceFEPIme (MAXIPIME) 2 g in dextrose 5 % 50 mL IVPB     2 g 100 mL/hr over 30 Minutes Intravenous  Once 03/17/16 1513 03/17/16 1644   03/17/16 1515  vancomycin (VANCOCIN) IVPB 1000 mg/200 mL premix     1,000 mg 200 mL/hr over 60 Minutes Intravenous  Once 03/17/16 1513 03/17/16 1745      Subjective:   Ricardo Hendricks seen and examined today.  Patient feels breathing has improved.  Currently denies chest pain, abdominal pain, nausea or vomiting, diarrhea.   Objective:   Vitals:   03/21/16 1103 03/21/16 1317 03/21/16 1355 03/21/16 1429  BP: 124/74 128/90  (!) 130/91  Pulse: 88 93  (!) 104  Resp:  20    Temp:      TempSrc:      SpO2:  94% 94%   Weight:      Height:        Intake/Output Summary (Last 24 hours) at 03/21/16 1446 Last data filed at 03/21/16 0957  Gross per 24 hour  Intake              120 ml  Output              700 ml  Net             -580 ml   Filed Weights   03/20/16 0300 03/21/16 0055 03/21/16 0500  Weight: 66.4 kg (146 lb 6.2 oz) 67.3 kg (148 lb 5.9 oz) 67.3 kg (148 lb 5.9 oz)    Exam  General: Well developed, chronically ill, NAD  HEENT: NCAT, mucous membranes moist. Small abrasion-back ofhead  Neck: Supple, no JVD, no masses  Cardiovascular: S1 S2 auscultated, irregular, no murmurs  Respiratory: Diminished breath sounds  Abdomen: Soft, nontender, nondistended, + bowel sounds  Extremities: warm dry without cyanosis clubbing or edema. Several abrasions, healing wounds on Lower ext  Neuro: AAOx3,  nonfocal  Psych: Normal affect and demeanor    Data Reviewed: I have personally reviewed following labs and imaging studies  CBC:  Recent Labs Lab 03/17/16 1529 03/18/16 0231 03/19/16 0050 03/20/16 0531 03/21/16 0445  WBC 2.0* 8.7 10.2 12.6* 10.4  NEUTROABS 1.7  --   --   --   --   HGB 13.5 11.1* 9.7* 11.1* 11.0*  HCT 39.5 33.3* 28.7* 33.6* 33.3*  MCV 97.8 98.2 97.6 97.4 95.4  PLT 200 177 125* 120* 149*   Basic Metabolic Panel:  Recent Labs Lab 03/18/16 0231 03/19/16 1233 03/20/16 0531 03/21/16 0010 03/21/16 0445 03/21/16 0830  NA 139 141 139 139 138  --   K 5.1 4.1 3.5 5.0 3.2* 3.3*  CL 109 114* 113* 105 108  --  CO2 22 22 19* 22 21*  --   GLUCOSE 70 90 96 90 84  --   BUN 29* 22* 14 10 7   --   CREATININE 1.08 0.73 0.54* 0.54* 0.56*  --   CALCIUM 8.3* 8.2* 8.0* 8.4* 7.9*  --   MG 1.7  --  1.8 1.6*  --   --   PHOS 3.3  --  2.5  --   --   --    GFR: Estimated Creatinine Clearance: 86.5 mL/min (by C-G formula based on SCr of 0.56 mg/dL (L)). Liver Function Tests:  Recent Labs Lab 03/17/16 1529  AST 43*  ALT 21  ALKPHOS 78  BILITOT 1.1  PROT 6.2*  ALBUMIN 3.3*   No results for input(s): LIPASE, AMYLASE in the last 168 hours. No results for input(s): AMMONIA in the last 168 hours. Coagulation Profile: No results for input(s): INR, PROTIME in the last 168 hours. Cardiac Enzymes:  Recent Labs Lab 03/17/16 2204 03/18/16 1244 03/18/16 1808 03/19/16 0050 03/19/16 1233  TROPONINI 0.51* 0.69* 0.56* 0.62* 0.50*   BNP (last 3 results) No results for input(s): PROBNP in the last 8760 hours. HbA1C: No results for input(s): HGBA1C in the last 72 hours. CBG:  Recent Labs Lab 03/17/16 2041  GLUCAP 85   Lipid Profile: No results for input(s): CHOL, HDL, LDLCALC, TRIG, CHOLHDL, LDLDIRECT in the last 72 hours. Thyroid Function Tests: No results for input(s): TSH, T4TOTAL, FREET4, T3FREE, THYROIDAB in the last 72 hours. Anemia Panel: No results for  input(s): VITAMINB12, FOLATE, FERRITIN, TIBC, IRON, RETICCTPCT in the last 72 hours. Urine analysis:    Component Value Date/Time   COLORURINE AMBER (A) 03/18/2016 0505   APPEARANCEUR HAZY (A) 03/18/2016 0505   LABSPEC 1.021 03/18/2016 0505   PHURINE 5.0 03/18/2016 0505   GLUCOSEU NEGATIVE 03/18/2016 0505   HGBUR MODERATE (A) 03/18/2016 0505   BILIRUBINUR NEGATIVE 03/18/2016 0505   KETONESUR NEGATIVE 03/18/2016 0505   PROTEINUR NEGATIVE 03/18/2016 0505   NITRITE NEGATIVE 03/18/2016 0505   LEUKOCYTESUR NEGATIVE 03/18/2016 0505   Sepsis Labs: @LABRCNTIP (procalcitonin:4,lacticidven:4)  ) Recent Results (from the past 240 hour(s))  Blood Culture (routine x 2)     Status: None (Preliminary result)   Collection Time: 03/17/16  3:30 PM  Result Value Ref Range Status   Specimen Description BLOOD RIGHT FEMORAL ARTERY  Final   Special Requests   Final    BOTTLES DRAWN AEROBIC AND ANAEROBIC 5CC BOTH BOTTLES   Culture   Final    NO GROWTH 4 DAYS Performed at [facility], [address], [place], [place] [address]    Report Status PENDING  Incomplete  Blood Culture (routine x 2)     Status: None (Preliminary result)   Collection Time: 03/17/16  3:45 PM  Result Value Ref Range Status   Specimen Description BLOOD left FA  Final   Special Requests BOTTLES DRAWN AEROBIC AND ANAEROBIC EACH  Final   Culture   Final    NO GROWTH 4 DAYS Performed at [facility], [address], [place], [place] [address]    Report Status PENDING  Incomplete  MRSA PCR Screening     Status: None   Collection Time: 03/17/16  9:02 PM  Result Value Ref Range Status   MRSA by PCR NEGATIVE NEGATIVE Final    Comment:        The GeneXpert MRSA Assay (FDA approved for NASAL specimens only), is one component of a comprehensive MRSA colonization  surveillance program. It is not intended to diagnose MRSA infection nor to guide or monitor treatment for MRSA infections.   Respiratory Panel by  PCR     Status: Abnormal   Collection Time: 03/18/16 12:02 AM  Result Value Ref Range Status   Adenovirus NOT DETECTED NOT DETECTED Final   Coronavirus 229E NOT DETECTED NOT DETECTED Final   Coronavirus HKU1 DETECTED (A) NOT DETECTED Final   Coronavirus NL63 NOT DETECTED NOT DETECTED Final   Coronavirus OC43 NOT DETECTED NOT DETECTED Final   Metapneumovirus NOT DETECTED NOT DETECTED Final   Rhinovirus / Enterovirus NOT DETECTED NOT DETECTED Final   Influenza A NOT DETECTED NOT DETECTED Final   Influenza B NOT DETECTED NOT DETECTED Final   Parainfluenza Virus 1 NOT DETECTED NOT DETECTED Final   Parainfluenza Virus 2 NOT DETECTED NOT DETECTED Final   Parainfluenza Virus 3 NOT DETECTED NOT DETECTED Final   Parainfluenza Virus 4 NOT DETECTED NOT DETECTED Final   Respiratory Syncytial Virus NOT DETECTED NOT DETECTED Final   Bordetella pertussis NOT DETECTED NOT DETECTED Final   Chlamydophila pneumoniae NOT DETECTED NOT DETECTED Final   Mycoplasma pneumoniae NOT DETECTED NOT DETECTED Final  Urine culture     Status: None   Collection Time: 03/18/16  5:05 AM  Result Value Ref Range Status   Specimen Description URINE, CATHETERIZED  Final   Special Requests NONE  Final   Culture NO GROWTH  Final   Report Status 03/19/2016 FINAL  Final      Radiology Studies: No results found.   Scheduled Meds: . allopurinol  300 mg Oral Daily  . aspirin EC  81 mg Oral Daily  . budesonide (PULMICORT) nebulizer solution  0.5 mg Nebulization BID  . ceFEPime (MAXIPIME) IV  1 g Intravenous Q12H  . clonazePAM  1 mg Oral TID  . diltiazem  60 mg Oral Q6H  . feeding supplement (ENSURE ENLIVE)  237 mL Oral BID BM  . ipratropium-albuterol  3 mL Nebulization Q6H  . mouth rinse  15 mL Mouth Rinse BID  . mirtazapine  45 mg Oral QHS  . polyethylene glycol  17 g Oral Daily  . pravastatin  40 mg Oral q1800  . rivaroxaban  20 mg Oral Q supper  . sodium chloride  500 mL Intravenous Once   Continuous  Infusions: . dextrose 5 % and 0.45% NaCl 50 mL/hr at 03/21/16 1012     LOS: 4 days   Time Spent in minutes   30 minutes  Chandler Stofer D.O. on 03/21/2016 at 2:46 PM  Between 7am to 7pm - Pager - [phone]  After 7pm go to [email or web address] - password [email or web address]  And look for the night coverage person covering for me after hours  [facility] Office  [phone]

## 2016-03-21 NOTE — Progress Notes (Addendum)
03/21/16 0900  What Happened  Was fall witnessed? No  Was patient injured? Yes  Patient found on floor  Found by Staff-comment (dietary)  Stated prior activity ambulating-unassisted  Follow Up  MD notified Mikhail  Time MD notified 0900  Family notified Yes-comment  Time family notified 0900 (ct scan)  Additional tests Yes-comment  Simple treatment Dressing  Progress note created (see row info) Yes  Adult Fall Risk Assessment  Risk Factor Category (scoring not indicated) High fall risk per protocol (document High fall risk)  Patient's Fall Risk High Fall Risk (>13 points)  Adult Fall Risk Interventions  Required Bundle Interventions *See Row Information* High fall risk - low, moderate, and high requirements implemented  Additional Interventions Use of appropriate toileting equipment (bedpan, BSC, etc.)  Screening for Fall Injury Risk  Risk For Fall Injury- See Row Information  Nurse judgement  Oxygen Therapy  SpO2 94 %  O2 Device Nasal Cannula  O2 Flow Rate (L/min) 4 L/min  Pain Assessment  Pain Assessment No/denies pain  PCA/Epidural/Spinal Assessment  Respiratory Pattern Regular;Unlabored  Neurological  Neuro (WDL) WDL  Level of Consciousness Alert  Orientation Level Oriented X4  Cognition Appropriate at baseline  Speech Clear  Pupil Assessment  Yes  R Pupil Size (mm) 3  R Pupil Shape Round  R Pupil Reaction Brisk  L Pupil Size (mm) 3  L Pupil Shape Round  L Pupil Reaction Brisk  Additional Pupil Assessments No  Motor Function/Sensation Assessment Grip;Motor strength;Elbow extension;Elbow flexion  R Hand Grip Moderate  L Hand Grip Moderate   R Elbow Extension (Push/Biceps) Present  L Elbow Extension (Push/Biceps) Present  R Elbow Flexion (Pull/Triceps) Present  L Elbow Flexion (Pull/Triceps) Present  RUE Motor Response Purposeful movement  RUE Motor Strength 5  LUE Motor Response Purposeful movement  LUE Motor Strength 5  RLE Motor Response Purposeful  movement  RLE Motor Strength 5  LLE Motor Response Purposeful movement  LLE Motor Strength 5  Glasgow Coma Scale  Eye Opening 4  Best Verbal Response (NON-intubated) 5  Best Motor Response 6  Glasgow Coma Scale Score 15  Musculoskeletal  Musculoskeletal (WDL) X  Assistive Device Standard walker  Generalized Weakness Yes  Weight Bearing Restrictions No  Integumentary  Integumentary (WDL) X  Skin Color Appropriate for ethnicity  Skin Condition Dry  Skin Integrity Abrasion  Abrasion Location Head;Elbow;Hip;Leg  Abrasion Location Orientation Proximal  Abrasion Intervention Cleansed;Foam  Ecchymosis Location Leg  Ecchymosis Location Orientation Bilateral

## 2016-03-21 NOTE — Progress Notes (Signed)
Patient Bp: 103/67 and Hr: 83.  No parameters for the Cardizem.  Notified the MD of the vitals.  Informed to administer.  Will continue to monitor patient

## 2016-03-21 NOTE — [place] FL2 (Signed)
[place] MEDICAID FL2 LEVEL OF CARE SCREENING TOOL     IDENTIFICATION  Patient Name: Ricardo Hendricks Birthdate: 01-15-1951 Sex: male Admission Date (Current Location): 03/17/2016  [facility] and [place] Number:  Producer, television/film/video and Address:  [facility] [place], [address] [place], [place] [address]      Provider Number: [id number]  Attending Physician Name and Address:  Edsel Petrin, DO  Relative Name and Phone Number:       Current Level of Care: Hospital Recommended Level of Care: Skilled Nursing Facility Prior Approval Number:    Date Approved/Denied:   PASRR Number:    Discharge Plan: SNF    Current Diagnoses: Patient Active Problem List   Diagnosis Date Noted  . Protein-calorie malnutrition, severe 03/19/2016  . HCAP (healthcare-associated pneumonia)   . AKI (acute kidney injury) (HCC)   . Sepsis (HCC) 03/17/2016  . Pressure injury of skin 03/17/2016    Orientation RESPIRATION BLADDER Height & Weight     Self, Time, Situation, Place  O2 (Nasal Cannula 3L) Continent Weight: 148 lb 5.9 oz (67.3 kg) Height:  5\' 11"  (180.3 cm)  BEHAVIORAL SYMPTOMS/MOOD NEUROLOGICAL BOWEL NUTRITION STATUS      Continent Diet (Heart healthy)  AMBULATORY STATUS COMMUNICATION OF NEEDS Skin   Extensive Assist Verbally PU Stage and Appropriate Care                       Personal Care Assistance Level of Assistance  Bathing, Feeding, Dressing Bathing Assistance: Maximum assistance Feeding assistance: Independent Dressing Assistance: Limited assistance     Functional Limitations Info             SPECIAL CARE FACTORS FREQUENCY  PT (By licensed PT), OT (By licensed OT)     PT Frequency: 5x OT Frequency: 5x            Contractures      Additional Factors Info  Code Status, Allergies Code Status Info: Full Allergies Info: No known allergies            Current Medications (03/21/2016):  This is the current hospital active  medication list Current Facility-Administered Medications  Medication Dose Route Frequency Provider Last Rate Last Dose  . 0.9 %  sodium chloride infusion  250 mL Intravenous PRN Rahul P Desai, PA-C      . allopurinol (ZYLOPRIM) tablet 300 mg  300 mg Oral Daily Praveen Mannam, MD   300 mg at 03/21/16 0746  . aspirin EC tablet 81 mg  81 mg Oral Daily Praveen Mannam, MD   81 mg at 03/21/16 0747  . budesonide (PULMICORT) nebulizer solution 0.5 mg  0.5 mg Nebulization BID Jose Alexis Frock, MD   0.5 mg at 03/21/16 0754  . ceFEPIme (MAXIPIME) 1 g in dextrose 5 % 50 mL IVPB  1 g Intravenous Q12H Belinda Fisher Stone, RPH   1 g at 03/21/16 1478  . clonazePAM (KLONOPIN) tablet 1 mg  1 mg Oral TID Chilton Greathouse, MD   1 mg at 03/21/16 0746  . dextrose 5 %-0.45 % sodium chloride infusion   Intravenous Continuous Praveen Mannam, MD 50 mL/hr at 03/21/16 1012    . diltiazem (CARDIZEM) tablet 60 mg  60 mg Oral Q6H Praveen Mannam, MD   60 mg at 03/21/16 1105  . feeding supplement (ENSURE ENLIVE) (ENSURE ENLIVE) liquid 237 mL  237 mL Oral BID BM Praveen Mannam, MD   237 mL at 03/21/16 0816  .  ipratropium-albuterol (DUONEB) 0.5-2.5 (3) MG/3ML nebulizer solution 3 mL  3 mL Nebulization Q6H Cyril Mourningakesh Alva V, MD   3 mL at 03/21/16 0754  . MEDLINE mouth rinse  15 mL Mouth Rinse BID Praveen Mannam, MD   15 mL at 03/21/16 0813  . metoprolol (LOPRESSOR) injection 5 mg  5 mg Intravenous Q6H PRN Praveen Mannam, MD      . mirtazapine (REMERON) tablet 45 mg  45 mg Oral QHS Praveen Mannam, MD   45 mg at 03/20/16 2107  . polyethylene glycol (MIRALAX / GLYCOLAX) packet 17 g  17 g Oral Daily [name], DO   17 g at 03/21/16 0748  . pravastatin (PRAVACHOL) tablet 40 mg  40 mg Oral q1800 Rahul P Desai, PA-C   40 mg at 03/20/16 1826  . rivaroxaban (XARELTO) tablet 20 mg  20 mg Oral Q supper Praveen Mannam, MD   20 mg at 03/20/16 1624  . sodium chloride 0.9 % bolus 500 mL  500 mL Intravenous Once Chilton GreathousePraveen Mannam, MD          Discharge Medications: Please see discharge summary for a list of discharge medications.  Relevant Imaging Results:  Relevant Lab Results:   Additional Information SSN: [id number]  Jonathon JordanLynn B Anabell Swint, [place]

## 2016-03-22 LAB — CBC
HEMATOCRIT: 32.6 % — AB (ref 39.0–52.0)
Hemoglobin: 10.9 g/dL — ABNORMAL LOW (ref 13.0–17.0)
MCH: 32.3 pg (ref 26.0–34.0)
MCHC: 33.4 g/dL (ref 30.0–36.0)
MCV: 96.7 fL (ref 78.0–100.0)
PLATELETS: 144 10*3/uL — AB (ref 150–400)
RBC: 3.37 MIL/uL — AB (ref 4.22–5.81)
RDW: 12.6 % (ref 11.5–15.5)
WBC: 9.4 10*3/uL (ref 4.0–10.5)

## 2016-03-22 LAB — CULTURE, BLOOD (ROUTINE X 2)
Culture: NO GROWTH
Culture: NO GROWTH

## 2016-03-22 LAB — BASIC METABOLIC PANEL
ANION GAP: 6 (ref 5–15)
BUN: 7 mg/dL (ref 6–20)
CHLORIDE: 110 mmol/L (ref 101–111)
CO2: 24 mmol/L (ref 22–32)
Calcium: 8 mg/dL — ABNORMAL LOW (ref 8.9–10.3)
Creatinine, Ser: 0.56 mg/dL — ABNORMAL LOW (ref 0.61–1.24)
GFR calc Af Amer: >60 mL/min (ref >60)
GFR calc non Af Amer: >60 mL/min (ref >60)
GLUCOSE: 100 mg/dL — AB (ref 65–99)
POTASSIUM: 3.8 mmol/L (ref 3.5–5.1)
Sodium: 140 mmol/L (ref 135–145)

## 2016-03-22 MED ORDER — CEFUROXIME AXETIL 250 MG PO TABS: 250.0000 mg | ORAL_TABLET | Freq: Two times a day (BID) | ORAL | 0 refills | Status: DC

## 2016-03-22 MED ORDER — SODIUM CHLORIDE 0.9 % IV BOLUS (SEPSIS)
500.0000 mL | Freq: Once | INTRAVENOUS | Status: AC
  Administered 2016-03-22: 500 mL via INTRAVENOUS

## 2016-03-22 MED ORDER — ENSURE ENLIVE PO LIQD: 237.0000 mL | Freq: Two times a day (BID) | ORAL | 12 refills | Status: AC

## 2016-03-22 MED ORDER — IBUPROFEN 400 MG PO TABS
400.0000 mg | ORAL_TABLET | Freq: Once | ORAL | Status: AC
  Administered 2016-03-22: 400 mg via ORAL
  Filled 2016-03-22: qty 1

## 2016-03-22 NOTE — Progress Notes (Signed)
SATURATION QUALIFICATIONS: (This note is used to comply with regulatory documentation for home oxygen)  Patient Saturations on Room Air at Rest = 87%  Patient Saturations on 4 Liters of oxygen while Ambulating = 78%  Please briefly explain why patient needs home oxygen: Pt on RA on 2/24 O2 sats 94%, Noc Sats dropped into 80's% put on 4 LNP. Ambulated this AM on 4 LNP Sats dropped to 78%, took a couple of minutes of rest to get sats up into the 90's on 4 LNP

## 2016-03-22 NOTE — Progress Notes (Addendum)
Physician requested info on pt's D/C to [facility].  CSW met with pt and family and verified pt's and pt's sister's preference for facility and updated physician.  Pt's referral still pending.  CSW signing off.  Alphonse Guild. Bevan Vu, Latanya Presser, LCAS Clinical Social Worker Ph: [phone]

## 2016-03-22 NOTE — Progress Notes (Signed)
PROGRESS NOTE    Ricardo Hendricks  WUJ:[id number] DOB: 1950-02-06 DOA: 03/17/2016 PCP: No primary care provider on file.   Chief Complaint  Patient presents with  . Cough    Brief Narrative:  HPI on 03/17/2016 by Dr. Girtha Rm (PCCM) Ricardo Hendricks is a 66 y.o. male with PMH as outlined below and who resides at nursing home. She presented to [facility] ED 03/17/16 with cough and congestion.  He had apparently been fine 2 - 3 days prior over the weekend.  When family went to check on him 02/20, they noted that he had rattling sound in his chest.  He had reportedly not had any fevers/chills/sweats, chest pain, N/V/D, abd pain.  In ED, he was found to be hypotensive and CXR was c/w PNA.  BP remained soft despite IVF resuscitation; therefore, pt was started on neosynephrine.  He was subsequently transferred to [facility] for further evaluation and management.  Per family, pt had recent admission this past winter for sepsis.  No notes in our system. Assessment & Plan   Acute hypoxic respiratory failure secondary to coronavirus pneumonia -Upon admission, SpO2 70-72% on room air -Chest x-ray showed bilateral lobe pneumonia -Influenza PCR negative, respiratory viral panel positive coronavirus HKU1 -Currently on supplemental oxygen, will continue to wean if possible. Patient currently needing 3-6 L -Continue vancomycin and cefepime -Continue pulmicort, nebs -Patient's O2 sats were 87% on room air at rest, dropped to 78% while on 4L and ambulating -patient will be discharged with oxygen  Septic shock secondary to coronavirus pneumonia -Sepsis etiology appears to have improved. Patient did require Neo-Synephrine as his BP did not respond to IV fluids. -Patient did have elevated lactic acid which is now normalized -Blood cultures show no growth to date -Urine culture shows no growth  Elevated troponin -Suspect due to septic shock -Denies chest pain -Echocardiogram EF 60-65%, basal inferior  hypokinesis, indeterminate diastolic function -BNP elevated 643 -Continue statin and aspirin  -Patient does have history of CAD and follows with cardiology in [facility] -Troponins have been flat  Fall -Patient did have fall today with small abrasion to his head -CT head no acute intracranial abnormalities  -Currently no new neuro deficits, no complaints of pain, headache, or dizziness -Spoke with patient's sister via phone, he has had several falls in the past.  Discussed Xarelto, she is aware of the risks of bleeding vs stroke and would like to continue Xarelto. Also discussed patient and his deconditioning, she agreed that patient likely needs SNF. -PT/OT consulted- recommended SNF -SW consulted  Atrial fibrillation -Continue aspirin and Xarelto, Cardizem  Acute kidney injury -Upon admission, creatinine 1.34 -Given IV fluids, creatinine currently 0.56 -Continue to monitor  Dysphasia/Severe malnutrition -Speech evaluation recommended thin liquid, regular diet -nutrition consulted, continue supplements  Acute anxiety/Bipolar disorder -Continue clonazepam and mirtazapine  Constipation -Continue miralax  Hypokalemia/hypomagnesemia -Replaced, continue to monitor  Code status: Discussed with sister (POA) and she does have a DNR form.   DVT Prophylaxis  xarelto  Code Status: DNR  Family Communication: None at bedside. Sister via phone  Disposition Plan: Admitted. Pending discharge to SNF when one is available  Consultants PCCM Cardiology via phone  Procedures  Echocardiogram  Antibiotics   Anti-infectives    Start     Dose/Rate Route Frequency Ordered Stop   03/18/16 1600  ceFEPIme (MAXIPIME) 1 g in dextrose 5 % 50 mL IVPB  Status:  Discontinued     1 g 100 mL/hr over 30 Minutes Intravenous Every 24  hours 03/17/16 1618 03/18/16 1022   03/18/16 1030  ceFEPIme (MAXIPIME) 1 g in dextrose 5 % 50 mL IVPB     1 g 100 mL/hr over 30 Minutes Intravenous Every 12 hours  03/18/16 1022     03/18/16 0500  vancomycin (VANCOCIN) 500 mg in sodium chloride 0.9 % 100 mL IVPB  Status:  Discontinued     500 mg 100 mL/hr over 60 Minutes Intravenous Every 12 hours 03/17/16 1618 03/19/16 1248   03/17/16 2230  oseltamivir (TAMIFLU) capsule 30 mg  Status:  Discontinued    Comments:  pls dose tamiflu. Awaiting influenza pcr   30 mg Oral 2 times daily 03/17/16 2225 03/18/16 1219   03/17/16 1541  ceFEPIme (MAXIPIME) 2 g injection    Comments:  Peel, Adrienne   : cabinet override      03/17/16 1541 03/18/16 0344   03/17/16 1515  ceFEPIme (MAXIPIME) 2 g in dextrose 5 % 50 mL IVPB     2 g 100 mL/hr over 30 Minutes Intravenous  Once 03/17/16 1513 03/17/16 1644   03/17/16 1515  vancomycin (VANCOCIN) IVPB 1000 mg/200 mL premix     1,000 mg 200 mL/hr over 60 Minutes Intravenous  Once 03/17/16 1513 03/17/16 1745      Subjective:   Ricardo Hendricks seen and examined today.  Patient feels breathing has improved.  Currently denies chest pain, abdominal pain, nausea or vomiting, diarrhea.   Objective:   Vitals:   03/22/16 0613 03/22/16 0621 03/22/16 0736 03/22/16 1006  BP: 114/78   110/68  Pulse: 80   75  Resp: 17   (!) 24  Temp: 97.5 F (36.4 C)   97.7 F (36.5 C)  TempSrc: Oral   Oral  SpO2: (!) 87% 94% 95% (!) 88%  Weight:      Height:        Intake/Output Summary (Last 24 hours) at 03/22/16 1143 Last data filed at 03/22/16 0954  Gross per 24 hour  Intake              240 ml  Output              800 ml  Net             -560 ml   Filed Weights   03/20/16 0300 03/21/16 0055 03/21/16 0500  Weight: 66.4 kg (146 lb 6.2 oz) 67.3 kg (148 lb 5.9 oz) 67.3 kg (148 lb 5.9 oz)    Exam  General: Well developed, chronically ill, NAD  HEENT: NCAT, mucous membranes moist. Small abrasion-back ofhead  Cardiovascular: S1 S2 auscultated, irregular, no murmurs  Respiratory: Diminished breath sounds  Abdomen: Soft, nontender, nondistended, + bowel sounds  Extremities:  warm dry without cyanosis clubbing or edema. Several abrasions, healing wounds on Lower ext  Neuro: AAOx3, nonfocal  Psych: Appropriate  Data Reviewed: I have personally reviewed following labs and imaging studies  CBC:  Recent Labs Lab 03/17/16 1529 03/18/16 0231 03/19/16 0050 03/20/16 0531 03/21/16 0445 03/22/16 0419  WBC 2.0* 8.7 10.2 12.6* 10.4 9.4  NEUTROABS 1.7  --   --   --   --   --   HGB 13.5 11.1* 9.7* 11.1* 11.0* 10.9*  HCT 39.5 33.3* 28.7* 33.6* 33.3* 32.6*  MCV 97.8 98.2 97.6 97.4 95.4 96.7  PLT 200 177 125* 120* 149* 144*   Basic Metabolic Panel:  Recent Labs Lab 03/18/16 0231 03/19/16 1233 03/20/16 0531 03/21/16 0010 03/21/16 0445 03/21/16 0830 03/22/16 0419  NA 139  141 139 139 138  --  140  K 5.1 4.1 3.5 5.0 3.2* 3.3* 3.8  CL 109 114* 113* 105 108  --  110  CO2 22 22 19* 22 21*  --  24  GLUCOSE 70 90 96 90 84  --  100*  BUN 29* 22* 14 10 7   --  7  CREATININE 1.08 0.73 0.54* 0.54* 0.56*  --  0.56*  CALCIUM 8.3* 8.2* 8.0* 8.4* 7.9*  --  8.0*  MG 1.7  --  1.8 1.6*  --   --   --   PHOS 3.3  --  2.5  --   --   --   --    GFR: Estimated Creatinine Clearance: 86.5 mL/min (by C-G formula based on SCr of 0.56 mg/dL (L)). Liver Function Tests:  Recent Labs Lab 03/17/16 1529  AST 43*  ALT 21  ALKPHOS 78  BILITOT 1.1  PROT 6.2*  ALBUMIN 3.3*   No results for input(s): LIPASE, AMYLASE in the last 168 hours. No results for input(s): AMMONIA in the last 168 hours. Coagulation Profile: No results for input(s): INR, PROTIME in the last 168 hours. Cardiac Enzymes:  Recent Labs Lab 03/17/16 2204 03/18/16 1244 03/18/16 1808 03/19/16 0050 03/19/16 1233  TROPONINI 0.51* 0.69* 0.56* 0.62* 0.50*   BNP (last 3 results) No results for input(s): PROBNP in the last 8760 hours. HbA1C: No results for input(s): HGBA1C in the last 72 hours. CBG:  Recent Labs Lab 03/17/16 2041  GLUCAP 85   Lipid Profile: No results for input(s): CHOL, HDL,  LDLCALC, TRIG, CHOLHDL, LDLDIRECT in the last 72 hours. Thyroid Function Tests: No results for input(s): TSH, T4TOTAL, FREET4, T3FREE, THYROIDAB in the last 72 hours. Anemia Panel: No results for input(s): VITAMINB12, FOLATE, FERRITIN, TIBC, IRON, RETICCTPCT in the last 72 hours. Urine analysis:    Component Value Date/Time   COLORURINE AMBER (A) 03/18/2016 0505   APPEARANCEUR HAZY (A) 03/18/2016 0505   LABSPEC 1.021 03/18/2016 0505   PHURINE 5.0 03/18/2016 0505   GLUCOSEU NEGATIVE 03/18/2016 0505   HGBUR MODERATE (A) 03/18/2016 0505   BILIRUBINUR NEGATIVE 03/18/2016 0505   KETONESUR NEGATIVE 03/18/2016 0505   PROTEINUR NEGATIVE 03/18/2016 0505   NITRITE NEGATIVE 03/18/2016 0505   LEUKOCYTESUR NEGATIVE 03/18/2016 0505   Sepsis Labs: @LABRCNTIP (procalcitonin:4,lacticidven:4)  ) Recent Results (from the past 240 hour(s))  Blood Culture (routine x 2)     Status: None (Preliminary result)   Collection Time: 03/17/16  3:30 PM  Result Value Ref Range Status   Specimen Description BLOOD RIGHT FEMORAL ARTERY  Final   Special Requests   Final    BOTTLES DRAWN AEROBIC AND ANAEROBIC 5CC BOTH BOTTLES   Culture   Final    NO GROWTH 4 DAYS Performed at [facility], [address], [place], [place] [address]    Report Status PENDING  Incomplete  Blood Culture (routine x 2)     Status: None (Preliminary result)   Collection Time: 03/17/16  3:45 PM  Result Value Ref Range Status   Specimen Description BLOOD left FA  Final   Special Requests BOTTLES DRAWN AEROBIC AND ANAEROBIC EACH  Final   Culture   Final    NO GROWTH 4 DAYS Performed at [facility], [address], [place], [place] [address]    Report Status PENDING  Incomplete  MRSA PCR Screening     Status: None   Collection Time: 03/17/16  9:02 PM  Result Value  Ref Range Status   MRSA by PCR NEGATIVE NEGATIVE Final    Comment:        The GeneXpert MRSA Assay (FDA approved for NASAL specimens only), is one  component of a comprehensive MRSA colonization surveillance program. It is not intended to diagnose MRSA infection nor to guide or monitor treatment for MRSA infections.   Respiratory Panel by PCR     Status: Abnormal   Collection Time: 03/18/16 12:02 AM  Result Value Ref Range Status   Adenovirus NOT DETECTED NOT DETECTED Final   Coronavirus 229E NOT DETECTED NOT DETECTED Final   Coronavirus HKU1 DETECTED (A) NOT DETECTED Final   Coronavirus NL63 NOT DETECTED NOT DETECTED Final   Coronavirus OC43 NOT DETECTED NOT DETECTED Final   Metapneumovirus NOT DETECTED NOT DETECTED Final   Rhinovirus / Enterovirus NOT DETECTED NOT DETECTED Final   Influenza A NOT DETECTED NOT DETECTED Final   Influenza B NOT DETECTED NOT DETECTED Final   Parainfluenza Virus 1 NOT DETECTED NOT DETECTED Final   Parainfluenza Virus 2 NOT DETECTED NOT DETECTED Final   Parainfluenza Virus 3 NOT DETECTED NOT DETECTED Final   Parainfluenza Virus 4 NOT DETECTED NOT DETECTED Final   Respiratory Syncytial Virus NOT DETECTED NOT DETECTED Final   Bordetella pertussis NOT DETECTED NOT DETECTED Final   Chlamydophila pneumoniae NOT DETECTED NOT DETECTED Final   Mycoplasma pneumoniae NOT DETECTED NOT DETECTED Final  Urine culture     Status: None   Collection Time: 03/18/16  5:05 AM  Result Value Ref Range Status   Specimen Description URINE, CATHETERIZED  Final   Special Requests NONE  Final   Culture NO GROWTH  Final   Report Status 03/19/2016 FINAL  Final      Radiology Studies: Ct Head Wo Contrast  Result Date: 03/21/2016 CLINICAL DATA:  Status post fall with a blow to the head today. EXAM: CT HEAD WITHOUT CONTRAST TECHNIQUE: Contiguous axial images were obtained from the base of the skull through the vertex without intravenous contrast. COMPARISON:  Head and cervical spine CT scans 12/31/2015. FINDINGS: Brain: Cortical atrophy and chronic microvascular ischemic change are again seen. No evidence of acute  abnormality including hemorrhage, infarct, mass lesion, mass effect, midline shift or abnormal extra-axial fluid collection is identified. No hydrocephalus or pneumocephalus. Vascular: Atherosclerotic vascular disease is noted. Skull: Intact. Sinuses/Orbits: Short air-fluid level in the right maxillary sinus is compatible with acute sinusitis. Paranasal sinuses are otherwise clear. The patient is status post bilateral lens extraction. Other: None. IMPRESSION: No acute intracranial abnormality. Findings compatible with acute right maxillary sinusitis. Atherosclerosis. Atrophy and chronic microvascular ischemic change. Electronically Signed   By: Drusilla Kanner M.D.   On: 03/21/2016 14:47     Scheduled Meds: . allopurinol  300 mg Oral Daily  . aspirin EC  81 mg Oral Daily  . budesonide (PULMICORT) nebulizer solution  0.5 mg Nebulization BID  . ceFEPime (MAXIPIME) IV  1 g Intravenous Q12H  . clonazePAM  1 mg Oral TID  . diltiazem  60 mg Oral Q6H  . feeding supplement (ENSURE ENLIVE)  237 mL Oral BID BM  . mouth rinse  15 mL Mouth Rinse BID  . mirtazapine  45 mg Oral QHS  . polyethylene glycol  17 g Oral Daily  . pravastatin  40 mg Oral q1800  . rivaroxaban  20 mg Oral Q supper  . sodium chloride  500 mL Intravenous Once   Continuous Infusions: . dextrose 5 % and 0.45% NaCl 50 mL/hr  at 03/22/16 0615     LOS: 5 days   Time Spent in minutes   30 minutes  Laelah Siravo D.O. on 03/22/2016 at 11:43 AM  Between 7am to 7pm - Pager - [phone]  After 7pm go to [email or web address] - password [email or web address]  And look for the night coverage person covering for me after hours  [facility] Office  [phone]

## 2016-03-22 NOTE — Progress Notes (Signed)
Patient temp 102.7 administered tylenol.  Temp is now 101.1. Notified on call NP K. [facility]site geologistchorr.

## 2016-03-22 NOTE — Discharge Summary (Signed)
Physician Discharge Summary  Ricardo Hendricks JWJ:[id number] DOB: 04-27-1950 DOA: 03/17/2016  PCP: No primary care provider on file.  Admit date: 03/17/2016 Discharge date: 03/23/2016  Time spent: 45 minutes  Recommendations for Outpatient Follow-up:  Patient will be discharged to skilled nursing facility.  Patient will need to follow up with primary care provider within one week of discharge, repeat BMP in one week.  Patient should continue medications as prescribed.  Patient should follow a regular diet.   Discharge Diagnoses:  Acute hypoxic respiratory failure secondary to coronavirus pneumonia Septic shock secondary to coronavirus pneumonia Elevated troponin Fall Atrial fibrillation Acute kidney injury Dysphasia/Severe malnutrition Acute anxiety/Bipolar disorder Constipation Hypokalemia/hypomagnesemia  Discharge Condition: Stable  Diet recommendation: regular  Filed Weights   03/21/16 0055 03/21/16 0500 03/23/16 0614  Weight: 67.3 kg (148 lb 5.9 oz) 67.3 kg (148 lb 5.9 oz) 67.5 kg (148 lb 13 oz)    History of present illness:  on 03/17/2016 by Dr. Girtha Rm (PCCM) Ricardo Hendricks a 66 y.o.malewith PMH as outlined below and who resides at nursing home. She presented to [facility] ED 03/17/16 with cough and congestion. He had apparently been fine 2 - 3 days prior over the weekend. When family went to check on him 02/20, they noted that he had rattling sound in his chest. He had reportedly not had any fevers/chills/sweats, chest pain, N/V/D, abd pain.  In ED, he was found to be hypotensive and CXR was c/w PNA. BP remained soft despite IVF resuscitation; therefore, pt was started on neosynephrine. He was subsequently transferred to [facility] for further evaluation and management.  Per family, pt had recent admission this past winter for sepsis. No notes in our system.  Hospital Course:  Acute hypoxic respiratory failure secondary to coronavirus pneumonia -Upon admission,  SpO2 70-72% on room air -Chest x-ray showed bilateral lobe pneumonia -Influenza PCR negative, respiratory viral panel positive coronavirus HKU1 -Currently on supplemental oxygen, will continue to wean if possible. Patient currently needing 3-6 L -Was placed on vancomycin and cefepime, continue ceftin at discharge -Continue pulmicort, nebs -Patient's O2 sats were 87% on room air at rest, dropped to 78% while on 4L and ambulating -patient will be discharged with oxygen  Septic shock secondary to coronavirus pneumonia -Sepsis etiology appears to have improved. Patient did require Neo-Synephrine as his BP did not respond to IV fluids. -Patient did have elevated lactic acid which is now normalized -Blood cultures show no growth to date -Urine culture shows no growth  Elevated troponin -Suspect due to septic shock -Denies chest pain -Echocardiogram EF 60-65%, basal inferior hypokinesis, indeterminate diastolic function -BNP elevated 643 -Continue statin and aspirin  -Patient does have history of CAD and follows with cardiology in [facility] -Troponins have been flat  Fall -Patient did have fall today with small abrasion to his head -CT head no acute intracranial abnormalities  -Currently no new neuro deficits, no complaints of pain, headache, or dizziness -Spoke with patient's sister via phone, he has had several falls in the past.  Discussed Xarelto, she is aware of the risks of bleeding vs stroke and would like to continue Xarelto. Also discussed patient and his deconditioning, she agreed that patient likely needs SNF. -PT/OT consulted- recommended SNF -SW consulted, will be discharged to SNF today  Atrial fibrillation -Continue aspirin and Xarelto, Cardizem  Acute kidney injury -Upon admission, creatinine 1.34 -Given IV fluids, creatinine currently 0.56 -Continue to monitor  Dysphasia/Severe malnutrition -Speech evaluation recommended thin liquid, regular diet -nutrition  consulted, continue  supplements  Acute anxiety/Bipolar disorder -Continue clonazepam and mirtazapine  Constipation -Continue miralax and dulcolax supp PRN  Hypokalemia/hypomagnesemia -Resolved, repeat BMP in one week  Code status: Discussed with sister (POA) and she does have a DNR form.   Consultants PCCM Cardiology via phone  Procedures  Echocardiogram  Discharge Exam: Vitals:   03/23/16 0614 03/23/16 0852  BP: 115/75   Pulse: 80 75  Resp: 17 18  Temp: 98.2 F (36.8 C)    Patient feels breathing has improved.  Currently denies chest pain, abdominal pain, nausea or vomiting, diarrhea.   Exam  General: Well developed, chronically ill, NAD  HEENT: NCAT, mucous membranes moist. Small abrasion-back ofhead  Cardiovascular: S1 S2 auscultated, irregular, no murmurs  Respiratory: Diminished breath sounds but clear  Abdomen: Soft, nontender, nondistended, + bowel sounds  Extremities: warm dry without cyanosis clubbing or edema. Several abrasions, healing wounds on Lower ext  Neuro: AAOx3, nonfocal  Psych: Appropriate mood and affect  Discharge Instructions Discharge Instructions    Discharge instructions    Complete by:  As directed    Patient will be discharged to skilled nursing facility.  Patient will need to follow up with primary care provider within one week of discharge.  Patient should continue medications as prescribed.  Patient should follow a regular diet.     Current Discharge Medication List    START taking these medications   Details  bisacodyl (DULCOLAX) 10 MG suppository Place 1 suppository (10 mg total) rectally daily as needed for moderate constipation. Qty: 12 suppository, Refills: 0    cefUROXime (CEFTIN) 250 MG tablet Take 1 tablet (250 mg total) by mouth 2 (two) times daily with a meal. Qty: 6 tablet, Refills: 0    feeding supplement, ENSURE ENLIVE, (ENSURE ENLIVE) LIQD Take 237 mLs by mouth 2 (two) times daily between meals. Qty:  237 mL, Refills: 12      CONTINUE these medications which have NOT CHANGED   Details  acetaminophen (TYLENOL) 650 MG CR tablet Take 1,300 mg by mouth every 8 (eight) hours as needed for pain.    allopurinol (ZYLOPRIM) 300 MG tablet Take 300 mg by mouth daily.    aspirin EC 81 MG tablet Take 81 mg by mouth daily.    clonazePAM (KLONOPIN) 1 MG tablet Take 1 mg by mouth 3 (three) times daily.     diltiazem (DILTIAZEM CD) 240 MG 24 hr capsule Take 240 mg by mouth daily.    docusate sodium (COLACE) 100 MG capsule Take 100 mg by mouth at bedtime.     meloxicam (MOBIC) 15 MG tablet Take 15 mg by mouth daily.    mirtazapine (REMERON) 45 MG tablet Take 45 mg by mouth at bedtime.     Multiple Vitamin (MULTIVITAMIN) capsule Take 1 capsule by mouth daily.    polyethylene glycol (MIRALAX / GLYCOLAX) packet Take 17 g by mouth daily.    pravastatin (PRAVACHOL) 40 MG tablet Take 40 mg by mouth daily.    rivaroxaban (XARELTO) 20 MG TABS tablet Take 20 mg by mouth daily with supper.       No Known Allergies Follow-up Information    Primary care physician. Schedule an appointment as soon as possible for a visit in 1 week(s).   Why:  Hospital follow up           The results of significant diagnostics from this hospitalization (including imaging, microbiology, ancillary and laboratory) are listed below for reference.    Significant Diagnostic Studies: Dg Chest 2  View  Result Date: 03/17/2016 CLINICAL DATA:  66 year old male with cough and fever for several days. Initial encounter. EXAM: CHEST  2 VIEW COMPARISON:  Chest radiographs 01/03/2016 and earlier. FINDINGS: Confluent bilateral lower lobe opacity. Questionable middle lobe or lingula involvement. No definite pleural effusion. Superimposed chronic pulmonary hyperinflation. No pneumothorax or edema. Stable cardiac size and mediastinal contours. Osteopenia. No acute osseous abnormality identified. IMPRESSION: Bilateral lower lobe  pneumonia. No definite pleural effusion. Chronic pulmonary hyperinflation. Followup PA and lateral chest X-ray is recommended in 3-4 weeks following trial of antibiotic therapy to ensure resolution and exclude underlying malignancy. Electronically Signed   By: Odessa Fleming M.D.   On: 03/17/2016 14:37   Ct Head Wo Contrast  Result Date: 03/21/2016 CLINICAL DATA:  Status post fall with a blow to the head today. EXAM: CT HEAD WITHOUT CONTRAST TECHNIQUE: Contiguous axial images were obtained from the base of the skull through the vertex without intravenous contrast. COMPARISON:  Head and cervical spine CT scans 12/31/2015. FINDINGS: Brain: Cortical atrophy and chronic microvascular ischemic change are again seen. No evidence of acute abnormality including hemorrhage, infarct, mass lesion, mass effect, midline shift or abnormal extra-axial fluid collection is identified. No hydrocephalus or pneumocephalus. Vascular: Atherosclerotic vascular disease is noted. Skull: Intact. Sinuses/Orbits: Short air-fluid level in the right maxillary sinus is compatible with acute sinusitis. Paranasal sinuses are otherwise clear. The patient is status post bilateral lens extraction. Other: None. IMPRESSION: No acute intracranial abnormality. Findings compatible with acute right maxillary sinusitis. Atherosclerosis. Atrophy and chronic microvascular ischemic change. Electronically Signed   By: Drusilla Kanner M.D.   On: 03/21/2016 14:47   Dg Chest Port 1 View  Result Date: 03/18/2016 CLINICAL DATA:  66 year old male with respiratory failure. EXAM: PORTABLE CHEST 1 VIEW COMPARISON:  Chest radiograph dated 03/17/2016 FINDINGS: Confluent bilateral lower lung field airspace opacities appear slightly progressed compared to the prior study. There is hyperinflation of the lungs likely related to underlying COPD. No significant pleural effusion. No pneumothorax. Stable mild cardiomegaly. Median sternotomy wires and left atrial appendage  occlusive device noted. No acute osseous pathology. IMPRESSION: Bilateral lower lobe pneumonia slightly progressed since the prior radiograph. Follow-up after treatment recommended to document resolution. Electronically Signed   By: Elgie Collard M.D.   On: 03/18/2016 05:12    Microbiology: Recent Results (from the past 240 hour(s))  Blood Culture (routine x 2)     Status: None   Collection Time: 03/17/16  3:30 PM  Result Value Ref Range Status   Specimen Description BLOOD RIGHT FEMORAL ARTERY  Final   Special Requests   Final    BOTTLES DRAWN AEROBIC AND ANAEROBIC 5CC BOTH BOTTLES   Culture   Final    NO GROWTH 5 DAYS Performed at [facility], [address], [place], [place] [address]    Report Status 03/22/2016 FINAL  Final  Blood Culture (routine x 2)     Status: None   Collection Time: 03/17/16  3:45 PM  Result Value Ref Range Status   Specimen Description BLOOD left FA  Final   Special Requests BOTTLES DRAWN AEROBIC AND ANAEROBIC EACH  Final   Culture   Final    NO GROWTH 5 DAYS Performed at [facility], [address] [place], [place] [address]    Report Status 03/22/2016 FINAL  Final  MRSA PCR Screening     Status: None   Collection Time: 03/17/16  9:02 PM  Result Value Ref Range Status  MRSA by PCR NEGATIVE NEGATIVE Final    Comment:        The GeneXpert MRSA Assay (FDA approved for NASAL specimens only), is one component of a comprehensive MRSA colonization surveillance program. It is not intended to diagnose MRSA infection nor to guide or monitor treatment for MRSA infections.   Respiratory Panel by PCR     Status: Abnormal   Collection Time: 03/18/16 12:02 AM  Result Value Ref Range Status   Adenovirus NOT DETECTED NOT DETECTED Final   Coronavirus 229E NOT DETECTED NOT DETECTED Final   Coronavirus HKU1 DETECTED (A) NOT DETECTED Final   Coronavirus NL63 NOT DETECTED NOT DETECTED Final   Coronavirus OC43 NOT DETECTED NOT DETECTED Final     Metapneumovirus NOT DETECTED NOT DETECTED Final   Rhinovirus / Enterovirus NOT DETECTED NOT DETECTED Final   Influenza A NOT DETECTED NOT DETECTED Final   Influenza B NOT DETECTED NOT DETECTED Final   Parainfluenza Virus 1 NOT DETECTED NOT DETECTED Final   Parainfluenza Virus 2 NOT DETECTED NOT DETECTED Final   Parainfluenza Virus 3 NOT DETECTED NOT DETECTED Final   Parainfluenza Virus 4 NOT DETECTED NOT DETECTED Final   Respiratory Syncytial Virus NOT DETECTED NOT DETECTED Final   Bordetella pertussis NOT DETECTED NOT DETECTED Final   Chlamydophila pneumoniae NOT DETECTED NOT DETECTED Final   Mycoplasma pneumoniae NOT DETECTED NOT DETECTED Final  Urine culture     Status: None   Collection Time: 03/18/16  5:05 AM  Result Value Ref Range Status   Specimen Description URINE, CATHETERIZED  Final   Special Requests NONE  Final   Culture NO GROWTH  Final   Report Status 03/19/2016 FINAL  Final     Labs: Basic Metabolic Panel:  Recent Labs Lab 03/18/16 0231  03/20/16 0531 03/21/16 0010 03/21/16 0445 03/21/16 0830 03/22/16 0419 03/23/16 0519  NA 139  < > 139 139 138  --  140 141  K 5.1  < > 3.5 5.0 3.2* 3.3* 3.8 4.1  CL 109  < > 113* 105 108  --  110 110  CO2 22  < > 19* 22 21*  --  24 24  GLUCOSE 70  < > 96 90 84  --  100* 122*  BUN 29*  < > 14 10 7   --  7 11  CREATININE 1.08  < > 0.54* 0.54* 0.56*  --  0.56* 0.56*  CALCIUM 8.3*  < > 8.0* 8.4* 7.9*  --  8.0* 8.0*  MG 1.7  --  1.8 1.6*  --   --   --  1.7  PHOS 3.3  --  2.5  --   --   --   --   --   < > = values in this interval not displayed. Liver Function Tests:  Recent Labs Lab 03/17/16 1529  AST 43*  ALT 21  ALKPHOS 78  BILITOT 1.1  PROT 6.2*  ALBUMIN 3.3*   No results for input(s): LIPASE, AMYLASE in the last 168 hours. No results for input(s): AMMONIA in the last 168 hours. CBC:  Recent Labs Lab 03/17/16 1529 03/18/16 0231 03/19/16 0050 03/20/16 0531 03/21/16 0445 03/22/16 0419  WBC 2.0* 8.7 10.2  12.6* 10.4 9.4  NEUTROABS 1.7  --   --   --   --   --   HGB 13.5 11.1* 9.7* 11.1* 11.0* 10.9*  HCT 39.5 33.3* 28.7* 33.6* 33.3* 32.6*  MCV 97.8 98.2 97.6 97.4 95.4 96.7  PLT 200 177  125* 120* 149* 144*   Cardiac Enzymes:  Recent Labs Lab 03/17/16 2204 03/18/16 1244 03/18/16 1808 03/19/16 0050 03/19/16 1233  TROPONINI 0.51* 0.69* 0.56* 0.62* 0.50*   BNP: BNP (last 3 results)  Recent Labs  03/19/16 1518  BNP 643.0*    ProBNP (last 3 results) No results for input(s): PROBNP in the last 8760 hours.  CBG:  Recent Labs Lab 03/17/16 2041  GLUCAP 85       Signed:  Edsel Petrin  [facility] 03/23/2016, 11:47 AM

## 2016-03-23 LAB — BASIC METABOLIC PANEL
Anion gap: 7 (ref 5–15)
BUN: 11 mg/dL (ref 6–20)
CALCIUM: 8 mg/dL — AB (ref 8.9–10.3)
CO2: 24 mmol/L (ref 22–32)
CREATININE: 0.56 mg/dL — AB (ref 0.61–1.24)
Chloride: 110 mmol/L (ref 101–111)
GFR calc non Af Amer: >60 mL/min (ref >60)
Glucose, Bld: 122 mg/dL — ABNORMAL HIGH (ref 65–99)
Potassium: 4.1 mmol/L (ref 3.5–5.1)
SODIUM: 141 mmol/L (ref 135–145)

## 2016-03-23 LAB — MAGNESIUM: Magnesium: 1.7 mg/dL (ref 1.7–2.4)

## 2016-03-23 MED ORDER — BISACODYL 10 MG RE SUPP: 10.0000 mg | Freq: Every day | RECTAL | Status: DC | PRN

## 2016-03-23 MED ORDER — BISACODYL 5 MG PO TBEC
5.0000 mg | DELAYED_RELEASE_TABLET | Freq: Every day | ORAL | Status: DC | PRN
  Administered 2016-03-23: 5 mg via ORAL
  Filled 2016-03-23: qty 1

## 2016-03-23 MED ORDER — BISACODYL 5 MG PO TBEC: 5.0000 mg | DELAYED_RELEASE_TABLET | Freq: Every day | ORAL | Status: DC | PRN

## 2016-03-23 MED ORDER — BISACODYL 10 MG RE SUPP: 10.0000 mg | Freq: Every day | RECTAL | 0 refills | Status: AC | PRN

## 2016-03-23 MED ORDER — CLONAZEPAM 1 MG PO TABS: 1.0000 mg | ORAL_TABLET | Freq: Three times a day (TID) | ORAL | 0 refills | Status: DC

## 2016-03-23 NOTE — Care Management Important Message (Signed)
Important Message  Patient Details  Name: Ricardo Hendricks MRN: [id number] Date of Birth: [birth year]   Medicare Important Message Given:  Yes    Kyla BalzarineShealy, Delton Stelle Abena 03/23/2016, 1:46 PM

## 2016-03-23 NOTE — Progress Notes (Signed)
[facility] Anticipated discharge date: 2/26 Family notified: Danelle EarthlyNoel Transportation by Sharin MonsPTAR- called at 3pm  CSW signing off.  Burna SisJenna H. Eugene Zeiders, LCSW Clinical Social Worker [phone]

## 2016-03-23 NOTE — Discharge Instructions (Signed)
Healthcare-Associated Pneumonia °Healthcare-associated pneumonia is a lung infection that a person can get when in a health care setting or during certain procedures. The infection causes air sacs inside the lungs to fill with pus or fluid. °Healthcare-associated pneumonia is usually caused by bacteria that are common in health care settings. These bacteria may be resistant to some antibiotic medicines. °What are the causes? °This condition is caused by bacteria that get into your lungs. You can get this condition if you: °· Breathe in droplets from an infected person's cough or sneeze. °· Touch something that an infected person coughed or sneezed on and then touch your mouth, nose, or eyes. °· Have a bacterial infection somewhere else in your body, if the bacteria spread to your lungs through your blood. °What increases the risk? °This condition is more likely to develop in people who: °· Have a disease that weakens their body's defense system (immune system) or their ability to cough out germs. °· Are older than age 65. °· Having trouble swallowing. °· Use a feeding or breathing tube. °· Have a cold or the flu. °· Have an IV tube inserted in a vein. °· Have surgery. °· Have a bed sore. °· Live in a long-term care facility, such as a nursing home. °· Were in the hospital for two or more days in the past 3 months. °· Received hemodialysis in the past 30 days. °What are the signs or symptoms? °Symptoms of this condition include: °· Fever. °· Chills. °· Cough. °· Shortness of breath. °· Wheezing or crackling sounds when breathing. °How is this diagnosed? °This condition may be diagnosed based on: °· Your symptoms. °· A chest X-ray. °· A measurement of the amount of oxygen in your blood. °How is this treated? °This condition is treated with antibiotics. Your health care provider may take a sample of cells (culture) from your throat to determine what type of bacteria is in your lungs and change your antibiotic based on  the results. If you have bacteria in your blood, trouble breathing, or a low oxygen level, you may need to be treated at the hospital. At the hospital, you will be given antibiotics through an IV tube. You may also be given oxygen or breathing treatments. °Follow these instructions at home: °Medicine °· Take your antibiotic medicine as told by your health care provider. Do not stop taking the antibiotic even if you start to feel better. °· Take over-the-counter and other prescription medicines only as told by your health care provider. °Activity °· Rest at home until you feel better. °· Return to your normal activities as told by your health care provider. Ask your health care provider what activities are safe for you. °General instructions °· Drink enough fluid to keep your urine clear or pale yellow. °· Do not use any products that contain nicotine or tobacco, such as cigarettes and e-cigarettes. If you need help quitting, ask your health care provider. °· Limit alcohol intake to no more than 1 drink per day for nonpregnant women and 2 drinks per day for men. One drink equals 12 oz of beer, 5 oz of wine, or 1½ oz of hard liquor. °· Keep all follow-up visits as told by your health care provider. This is important. °How is this prevented? °Actions that I can take °To lower your risk of getting this condition again: °· Do not smoke. This includes e-cigarettes. °· Do not drink too much alcohol. °· Keep your immune system healthy by eating well and   getting enough sleep. °· Get a flu shot every year (annually). °· Get a pneumonia vaccination if: °¨ You are older than age 65. °¨ You smoke. °¨ You have a long-lasting condition like lung disease. °· Exercise your lungs by taking deep breaths, walking, and using an incentive spirometer as directed. °· Wash your hands often with soap and water. If you cannot get to a sink to wash your hands, use an alcohol-based hand cleaner. °· Make sure your health care providers are  washing their hands. If you do not see them wash their hands, ask them to do so. °· When you are in a health care facility, avoid touching your eyes, nose, and mouth. °· Avoid touching any surface near where people have coughed or sneezed. °· Stand away from sick people when they are coughing or sneezing. °· Wear a mask if you cannot avoid exposure to people who are sick. °· Clean all surfaces often with a disinfectant cleaner, especially if someone is sick at home or work. °Precautions of my health care team °Hospitals, nursing homes, and other health care facilities take special care to try to prevent healthcare-associated pneumonia. To do this, your health care team may: °· Clean their hands with soap and water or with alcohol-based hand sanitizer before and after seeing patients. °· Wear gloves or masks during treatment. °· Sanitize medical instruments, tubes, other equipment, and surfaces in patient rooms. °· Raise (elevate) the head of your hospital bed so you are not lying flat. The head of the bed may be elevated 30 degrees or more. °· Have you sit up and move around as soon as possible after surgery. °· Only insert a breathing tube if needed. °· Do these things for you if you have a breathing tube: °¨ Clean the inside of your mouth regularly. °¨ Remove the breathing tube as soon as it is no longer needed. °Contact a health care provider if: °· Your symptoms do not get better or they get worse. °· Your symptoms come back after you have finished taking your antibiotics. °Get help right away if: °· You have trouble breathing. °· You have confusion or difficulty thinking. °This information is not intended to replace advice given to you by your health care provider. Make sure you discuss any questions you have with your health care provider. °Document Released: 06/04/2015 Document Revised: 10/29/2015 Document Reviewed: 10/11/2015 °Elsevier Interactive Patient Education © 2017 [employer] ° °

## 2016-03-23 NOTE — Care Management Note (Addendum)
Case Management Note  Patient Details  Name: Ricardo Hendricks MRN: [id number] Date of Birth: April 06, 1950  Subjective/Objective:            Admitted with Sepsis.      Action/Plan: Plan is d/c to SNF today. CSW managing disposition to facility.  Expected Discharge Date:  03/22/16                Expected Discharge Plan:  [facility]  In-House Referral:  Clinical Social Work  Discharge planning Services  CM Consult   Status of Service:  Completed, signed off  If discussed at [employer] Length of Stay Meetings, dates discussed:    Additional Comments:  Epifanio LeschesCole, Ricardo Thorner Hudson, RN 03/23/2016, 11:34 AM

## 2016-03-23 NOTE — Progress Notes (Signed)
OT Cancellation Note  Patient Details Name: Ricardo Hendricks MRN: [id number] DOB: 1950-09-30   Cancelled Treatment:    Reason Eval/Treat Not Completed: Patient declined, no reason specified. Pt/family politely declined and will be d/[facility] this afternoon, RN confirmed  [name], Ricardo Hendricks 03/23/2016, 12:12 PM

## 2016-03-23 NOTE — Progress Notes (Signed)
NURSING PROGRESS NOTE  Ricardo MayWilliam Hendricks [id number] Discharge Data: 03/23/2016 3:34 PM Attending Provider: Edsel PetrinMaryann Mikhail, DO PCP:No primary care provider on file.     Ricardo MayWilliam Hendricks to be D/C'd Skilled nursing [facility] per MD order.  Discussed with the patient the After Visit Summary and all questions fully answered. All IV's discontinued with no bleeding noted. Reported called to Edwin DadaBrandy, R[facility] at [facility].   Last Vital Signs:  Blood pressure 132/85, pulse 90, temperature 98.7 F (37.1 C), temperature source Oral, resp. rate 20, height 5\' 11"  (1.803 m), weight 67.5 kg (148 lb 13 oz), SpO2 93 %.  Discharge Medication List Allergies as of 03/23/2016   No Known Allergies     Medication List    TAKE these medications   acetaminophen 650 MG CR tablet Commonly known as:  TYLENOL Take 1,300 mg by mouth every 8 (eight) hours as needed for pain.   allopurinol 300 MG tablet Commonly known as:  ZYLOPRIM Take 300 mg by mouth daily.   aspirin EC 81 MG tablet Take 81 mg by mouth daily.   bisacodyl 10 MG suppository Commonly known as:  DULCOLAX Place 1 suppository (10 mg total) rectally daily as needed for moderate constipation.   cefUROXime 250 MG tablet Commonly known as:  CEFTIN Take 1 tablet (250 mg total) by mouth 2 (two) times daily with a meal.   clonazePAM 1 MG tablet Commonly known as:  KLONOPIN Take 1 mg by mouth 3 (three) times daily.   DILTIAZEM CD 240 MG 24 hr capsule Generic drug:  diltiazem Take 240 mg by mouth daily.   docusate sodium 100 MG capsule Commonly known as:  COLACE Take 100 mg by mouth at bedtime.   feeding supplement (ENSURE ENLIVE) Liqd Take 237 mLs by mouth 2 (two) times daily between meals.   meloxicam 15 MG tablet Commonly known as:  MOBIC Take 15 mg by mouth daily.   mirtazapine 45 MG tablet Commonly known as:  REMERON Take 45 mg by mouth at bedtime.   multivitamin capsule Take 1 capsule by mouth daily.    polyethylene glycol packet Commonly known as:  MIRALAX / GLYCOLAX Take 17 g by mouth daily.   pravastatin 40 MG tablet Commonly known as:  PRAVACHOL Take 40 mg by mouth daily.   rivaroxaban 20 MG Tabs tablet Commonly known as:  XARELTO Take 20 mg by mouth daily with supper.            Durable Medical Equipment        Start     Ordered   03/22/16 1323  For home use only DME oxygen  Once    Question Answer Comment  Mode or (Route) Nasal cannula   Liters per Minute 4   Frequency Continuous (stationary and portable oxygen unit needed)   Oxygen conserving device Yes   Oxygen delivery system Gas      03/22/16 1322

## 2016-03-23 NOTE — Clinical Social Work Placement (Signed)
   CLINICAL SOCIAL WORK PLACEMENT  NOTE  Date:  03/23/2016  Patient Details  Name: Ricardo Hendricks MRN: [id number] Date of Birth: 1950/02/07  Clinical Social Work is seeking post-discharge placement for this patient at the Skilled  Nursing Facility level of care (*CSW will initial, date and re-position this form in  chart as items are completed):  Yes   Patient/family provided with [facility] Clinical Social Work Department's list of facilities offering this level of care within the geographic area requested by the patient (or if unable, by the patient's family).  Yes   Patient/family informed of their freedom to choose among providers that offer the needed level of care, that participate in Medicare, Medicaid or managed care program needed by the patient, have an available bed and are willing to accept the patient.  Yes   Patient/family informed of [facility] ownership interest in [facility] and [facility], as well as of the fact that they are under no obligation to receive care at these facilities.  PASRR submitted to EDS on 03/21/16     PASRR number received on 03/21/16     Existing PASRR number confirmed on       FL2 transmitted to all facilities in geographic area requested by pt/family on       FL2 transmitted to all facilities within larger geographic area on       Patient informed that his/her managed care company has contracts with or will negotiate with certain facilities, including the following:        Yes   Patient/family informed of bed offers received.  [facility]     Physician recommends and patient chooses bed at      [facility] on 03/[facility].  Patient to be transferred to facility by ptar     Patient family notified on 03/23/16 of transfer.  Name of family member notified:  Danelle EarthlyNoel     PHYSICIAN Please sign DNR     Additional Comment:     _______________________________________________ Burna SisUris, Moana Munford H, LCSW 03/23/2016, 3:05 PM

## 2016-04-19 ENCOUNTER — Emergency Department (HOSPITAL_BASED_OUTPATIENT_CLINIC_OR_DEPARTMENT_OTHER): Payer: Medicare Other

## 2016-04-19 ENCOUNTER — Encounter (HOSPITAL_BASED_OUTPATIENT_CLINIC_OR_DEPARTMENT_OTHER): Payer: Self-pay | Admitting: *Deleted

## 2016-04-19 ENCOUNTER — Inpatient Hospital Stay (HOSPITAL_BASED_OUTPATIENT_CLINIC_OR_DEPARTMENT_OTHER)
Admission: EM | Admit: 2016-04-19 | Discharge: 2016-04-24 | DRG: 560 | Disposition: A | Discharge status: Skilled Nursing Facility | Payer: Medicare Other | Attending: Internal Medicine | Admitting: Internal Medicine

## 2016-04-19 ENCOUNTER — Inpatient Hospital Stay (HOSPITAL_BASED_OUTPATIENT_CLINIC_OR_DEPARTMENT_OTHER): Payer: Medicare Other

## 2016-04-19 DIAGNOSIS — I2510 Atherosclerotic heart disease of native coronary artery without angina pectoris: Secondary | ICD-10-CM | POA: Diagnosis present

## 2016-04-19 DIAGNOSIS — Z9181 History of falling: Secondary | ICD-10-CM

## 2016-04-19 DIAGNOSIS — Z96611 Presence of right artificial shoulder joint: Secondary | ICD-10-CM | POA: Diagnosis present

## 2016-04-19 DIAGNOSIS — I10 Essential (primary) hypertension: Secondary | ICD-10-CM

## 2016-04-19 DIAGNOSIS — Z79899 Other long term (current) drug therapy: Secondary | ICD-10-CM | POA: Diagnosis not present

## 2016-04-19 DIAGNOSIS — M978XXA Periprosthetic fracture around other internal prosthetic joint, initial encounter: Secondary | ICD-10-CM | POA: Diagnosis not present

## 2016-04-19 DIAGNOSIS — S51011A Laceration without foreign body of right elbow, initial encounter: Secondary | ICD-10-CM | POA: Diagnosis present

## 2016-04-19 DIAGNOSIS — Z6821 Body mass index (BMI) 21.0-21.9, adult: Secondary | ICD-10-CM

## 2016-04-19 DIAGNOSIS — F439 Reaction to severe stress, unspecified: Secondary | ICD-10-CM | POA: Diagnosis present

## 2016-04-19 DIAGNOSIS — E785 Hyperlipidemia, unspecified: Secondary | ICD-10-CM | POA: Diagnosis present

## 2016-04-19 DIAGNOSIS — L89512 Pressure ulcer of right ankle, stage 2: Secondary | ICD-10-CM | POA: Diagnosis present

## 2016-04-19 DIAGNOSIS — Z96612 Presence of left artificial shoulder joint: Secondary | ICD-10-CM | POA: Diagnosis present

## 2016-04-19 DIAGNOSIS — R64 Cachexia: Secondary | ICD-10-CM | POA: Diagnosis present

## 2016-04-19 DIAGNOSIS — D649 Anemia, unspecified: Secondary | ICD-10-CM | POA: Diagnosis present

## 2016-04-19 DIAGNOSIS — I482 Chronic atrial fibrillation, unspecified: Secondary | ICD-10-CM | POA: Diagnosis present

## 2016-04-19 DIAGNOSIS — D62 Acute posthemorrhagic anemia: Secondary | ICD-10-CM | POA: Diagnosis not present

## 2016-04-19 DIAGNOSIS — Z791 Long term (current) use of non-steroidal anti-inflammatories (NSAID): Secondary | ICD-10-CM | POA: Diagnosis not present

## 2016-04-19 DIAGNOSIS — D72829 Elevated white blood cell count, unspecified: Secondary | ICD-10-CM

## 2016-04-19 DIAGNOSIS — K5900 Constipation, unspecified: Secondary | ICD-10-CM

## 2016-04-19 DIAGNOSIS — Z7982 Long term (current) use of aspirin: Secondary | ICD-10-CM

## 2016-04-19 DIAGNOSIS — Y92099 Unspecified place in other non-institutional residence as the place of occurrence of the external cause: Secondary | ICD-10-CM

## 2016-04-19 DIAGNOSIS — M9701XA Periprosthetic fracture around internal prosthetic right hip joint, initial encounter: Principal | ICD-10-CM

## 2016-04-19 DIAGNOSIS — D500 Iron deficiency anemia secondary to blood loss (chronic): Secondary | ICD-10-CM | POA: Diagnosis present

## 2016-04-19 DIAGNOSIS — M1A9XX0 Chronic gout, unspecified, without tophus (tophi): Secondary | ICD-10-CM | POA: Diagnosis not present

## 2016-04-19 DIAGNOSIS — Z96649 Presence of unspecified artificial hip joint: Secondary | ICD-10-CM | POA: Diagnosis not present

## 2016-04-19 DIAGNOSIS — F319 Bipolar disorder, unspecified: Secondary | ICD-10-CM | POA: Diagnosis present

## 2016-04-19 DIAGNOSIS — I959 Hypotension, unspecified: Secondary | ICD-10-CM | POA: Diagnosis present

## 2016-04-19 DIAGNOSIS — R0902 Hypoxemia: Secondary | ICD-10-CM | POA: Diagnosis present

## 2016-04-19 DIAGNOSIS — W19XXXA Unspecified fall, initial encounter: Secondary | ICD-10-CM | POA: Diagnosis present

## 2016-04-19 DIAGNOSIS — S72001A Fracture of unspecified part of neck of right femur, initial encounter for closed fracture: Secondary | ICD-10-CM

## 2016-04-19 DIAGNOSIS — I25810 Atherosclerosis of coronary artery bypass graft(s) without angina pectoris: Secondary | ICD-10-CM | POA: Diagnosis not present

## 2016-04-19 DIAGNOSIS — R627 Adult failure to thrive: Secondary | ICD-10-CM

## 2016-04-19 DIAGNOSIS — Z7901 Long term (current) use of anticoagulants: Secondary | ICD-10-CM | POA: Diagnosis not present

## 2016-04-19 DIAGNOSIS — S81012A Laceration without foreign body, left knee, initial encounter: Secondary | ICD-10-CM | POA: Diagnosis present

## 2016-04-19 DIAGNOSIS — W010XXA Fall on same level from slipping, tripping and stumbling without subsequent striking against object, initial encounter: Secondary | ICD-10-CM | POA: Diagnosis present

## 2016-04-19 DIAGNOSIS — Z515 Encounter for palliative care: Secondary | ICD-10-CM | POA: Diagnosis not present

## 2016-04-19 DIAGNOSIS — M109 Gout, unspecified: Secondary | ICD-10-CM | POA: Diagnosis present

## 2016-04-19 DIAGNOSIS — M978XXD Periprosthetic fracture around other internal prosthetic joint, subsequent encounter: Secondary | ICD-10-CM | POA: Diagnosis not present

## 2016-04-19 DIAGNOSIS — F418 Other specified anxiety disorders: Secondary | ICD-10-CM | POA: Diagnosis present

## 2016-04-19 DIAGNOSIS — R330 Drug induced retention of urine: Secondary | ICD-10-CM | POA: Diagnosis present

## 2016-04-19 DIAGNOSIS — Z8249 Family history of ischemic heart disease and other diseases of the circulatory system: Secondary | ICD-10-CM

## 2016-04-19 DIAGNOSIS — Y92239 Unspecified place in hospital as the place of occurrence of the external cause: Secondary | ICD-10-CM | POA: Diagnosis present

## 2016-04-19 DIAGNOSIS — Z8701 Personal history of pneumonia (recurrent): Secondary | ICD-10-CM

## 2016-04-19 DIAGNOSIS — R0600 Dyspnea, unspecified: Secondary | ICD-10-CM

## 2016-04-19 DIAGNOSIS — Z951 Presence of aortocoronary bypass graft: Secondary | ICD-10-CM

## 2016-04-19 DIAGNOSIS — Z66 Do not resuscitate: Secondary | ICD-10-CM | POA: Diagnosis present

## 2016-04-19 DIAGNOSIS — T40605A Adverse effect of unspecified narcotics, initial encounter: Secondary | ICD-10-CM | POA: Diagnosis present

## 2016-04-19 DIAGNOSIS — D696 Thrombocytopenia, unspecified: Secondary | ICD-10-CM | POA: Diagnosis present

## 2016-04-19 HISTORY — DX: Atherosclerotic heart disease of native coronary artery without angina pectoris: I25.10

## 2016-04-19 LAB — CBC WITH DIFFERENTIAL/PLATELET
BASOS PCT: 0 %
Basophils Absolute: 0 10*3/uL (ref 0.0–0.1)
EOS PCT: 0 %
Eosinophils Absolute: 0 10*3/uL (ref 0.0–0.7)
HCT: 38.9 % — ABNORMAL LOW (ref 39.0–52.0)
HEMOGLOBIN: 12.8 g/dL — AB (ref 13.0–17.0)
Lymphocytes Relative: 3 %
Lymphs Abs: 0.8 10*3/uL (ref 0.7–4.0)
MCH: 30.8 pg (ref 26.0–34.0)
MCHC: 32.9 g/dL (ref 30.0–36.0)
MCV: 93.5 fL (ref 78.0–100.0)
MONOS PCT: 3 %
Monocytes Absolute: 0.6 10*3/uL (ref 0.1–1.0)
NEUTROS PCT: 94 %
Neutro Abs: 22.4 10*3/uL — ABNORMAL HIGH (ref 1.7–7.7)
PLATELETS: 262 10*3/uL (ref 150–400)
RBC: 4.16 MIL/uL — AB (ref 4.22–5.81)
RDW: 13.2 % (ref 11.5–15.5)
WBC: 23.8 10*3/uL — AB (ref 4.0–10.5)

## 2016-04-19 LAB — URINALYSIS, MICROSCOPIC (REFLEX)

## 2016-04-19 LAB — COMPREHENSIVE METABOLIC PANEL
ALK PHOS: 120 U/L (ref 38–126)
ALT: 17 U/L (ref 17–63)
AST: 54 U/L — AB (ref 15–41)
Albumin: 3.3 g/dL — ABNORMAL LOW (ref 3.5–5.0)
Anion gap: 14 (ref 5–15)
BUN: 20 mg/dL (ref 6–20)
CALCIUM: 9.3 mg/dL (ref 8.9–10.3)
CO2: 25 mmol/L (ref 22–32)
CREATININE: 1.07 mg/dL (ref 0.61–1.24)
Chloride: 102 mmol/L (ref 101–111)
Glucose, Bld: 142 mg/dL — ABNORMAL HIGH (ref 65–99)
Potassium: 4.6 mmol/L (ref 3.5–5.1)
Sodium: 141 mmol/L (ref 135–145)
Total Bilirubin: 0.6 mg/dL (ref 0.3–1.2)
Total Protein: 6.6 g/dL (ref 6.5–8.1)

## 2016-04-19 LAB — URINALYSIS, ROUTINE W REFLEX MICROSCOPIC
BILIRUBIN URINE: NEGATIVE
Glucose, UA: NEGATIVE mg/dL
KETONES UR: NEGATIVE mg/dL
NITRITE: NEGATIVE
PROTEIN: NEGATIVE mg/dL
SPECIFIC GRAVITY, URINE: 1.018 (ref 1.005–1.030)
pH: 7 (ref 5.0–8.0)

## 2016-04-19 LAB — CK: CK TOTAL: 150 U/L (ref 49–397)

## 2016-04-19 MED ORDER — BISACODYL 10 MG RE SUPP
10.0000 mg | Freq: Every day | RECTAL | Status: DC | PRN
  Administered 2016-04-21: 10 mg via RECTAL
  Filled 2016-04-19: qty 1

## 2016-04-19 MED ORDER — METHOCARBAMOL 500 MG PO TABS
500.0000 mg | ORAL_TABLET | Freq: Three times a day (TID) | ORAL | Status: DC | PRN
  Administered 2016-04-20: 500 mg via ORAL
  Filled 2016-04-19: qty 1

## 2016-04-19 MED ORDER — ALLOPURINOL 300 MG PO TABS
300.0000 mg | ORAL_TABLET | Freq: Every day | ORAL | Status: DC
  Administered 2016-04-20 – 2016-04-24 (×5): 300 mg via ORAL
  Filled 2016-04-19 (×5): qty 1

## 2016-04-19 MED ORDER — PRAVASTATIN SODIUM 40 MG PO TABS
40.0000 mg | ORAL_TABLET | Freq: Every day | ORAL | Status: DC
  Administered 2016-04-20 – 2016-04-23 (×4): 40 mg via ORAL
  Filled 2016-04-19 (×4): qty 1

## 2016-04-19 MED ORDER — ENSURE ENLIVE PO LIQD: 237.0000 mL | Freq: Two times a day (BID) | ORAL | Status: DC

## 2016-04-19 MED ORDER — MORPHINE SULFATE (PF) 4 MG/ML IV SOLN
4.0000 mg | Freq: Once | INTRAVENOUS | Status: AC
  Administered 2016-04-19: 4 mg via INTRAVENOUS
  Filled 2016-04-19: qty 1

## 2016-04-19 MED ORDER — POLYETHYLENE GLYCOL 3350 17 G PO PACK: 17.0000 g | PACK | Freq: Every day | ORAL | Status: DC | PRN

## 2016-04-19 MED ORDER — ADULT MULTIVITAMIN W/MINERALS CH
1.0000 | ORAL_TABLET | Freq: Every day | ORAL | Status: DC
  Administered 2016-04-20 – 2016-04-24 (×5): 1 via ORAL
  Filled 2016-04-19 (×5): qty 1

## 2016-04-19 MED ORDER — OXYCODONE-ACETAMINOPHEN 5-325 MG PO TABS
1.0000 | ORAL_TABLET | ORAL | Status: DC | PRN
  Administered 2016-04-20 – 2016-04-24 (×7): 1 via ORAL
  Filled 2016-04-19 (×7): qty 1

## 2016-04-19 MED ORDER — ACETAMINOPHEN 325 MG PO TABS
650.0000 mg | ORAL_TABLET | Freq: Three times a day (TID) | ORAL | Status: DC | PRN
  Administered 2016-04-22 – 2016-04-24 (×5): 650 mg via ORAL
  Filled 2016-04-19 (×5): qty 2

## 2016-04-19 MED ORDER — ONDANSETRON HCL 4 MG/2ML IJ SOLN: 4.0000 mg | Freq: Three times a day (TID) | INTRAMUSCULAR | Status: DC | PRN

## 2016-04-19 MED ORDER — DILTIAZEM HCL ER COATED BEADS 180 MG PO CP24
180.0000 mg | ORAL_CAPSULE | Freq: Every day | ORAL | Status: DC
  Administered 2016-04-20 – 2016-04-23 (×4): 180 mg via ORAL
  Filled 2016-04-19 (×5): qty 1

## 2016-04-19 MED ORDER — CLONAZEPAM 1 MG PO TABS
1.0000 mg | ORAL_TABLET | Freq: Three times a day (TID) | ORAL | Status: DC
  Administered 2016-04-20 – 2016-04-24 (×15): 1 mg via ORAL
  Filled 2016-04-19 (×15): qty 1

## 2016-04-19 MED ORDER — DOCUSATE SODIUM 100 MG PO CAPS
100.0000 mg | ORAL_CAPSULE | Freq: Every day | ORAL | Status: DC
  Administered 2016-04-20 – 2016-04-21 (×3): 100 mg via ORAL
  Filled 2016-04-19 (×4): qty 1

## 2016-04-19 MED ORDER — SODIUM CHLORIDE 0.9 % IV BOLUS (SEPSIS)
1000.0000 mL | Freq: Once | INTRAVENOUS | Status: AC
  Administered 2016-04-19: 1000 mL via INTRAVENOUS

## 2016-04-19 MED ORDER — MORPHINE SULFATE (PF) 2 MG/ML IV SOLN: 2.0000 mg | INTRAVENOUS | Status: DC | PRN

## 2016-04-19 MED ORDER — SODIUM CHLORIDE 0.9 % IV BOLUS (SEPSIS)
500.0000 mL | Freq: Once | INTRAVENOUS | Status: AC
  Administered 2016-04-19: 500 mL via INTRAVENOUS

## 2016-04-19 MED ORDER — ZOLPIDEM TARTRATE 5 MG PO TABS
5.0000 mg | ORAL_TABLET | Freq: Every evening | ORAL | Status: DC | PRN
  Administered 2016-04-20: 5 mg via ORAL
  Filled 2016-04-19: qty 1

## 2016-04-19 MED ORDER — SODIUM CHLORIDE 0.9 % IV SOLN
INTRAVENOUS | Status: DC
  Administered 2016-04-20: via INTRAVENOUS
  Administered 2016-04-20: 75 mL/h via INTRAVENOUS
  Administered 2016-04-20 – 2016-04-22 (×4): via INTRAVENOUS

## 2016-04-19 MED ORDER — ASPIRIN EC 81 MG PO TBEC: 81.0000 mg | DELAYED_RELEASE_TABLET | Freq: Every day | ORAL | Status: DC

## 2016-04-19 MED ORDER — MELOXICAM 7.5 MG PO TABS: 7.5000 mg | ORAL_TABLET | Freq: Every evening | ORAL | Status: DC | PRN

## 2016-04-19 NOTE — ED Notes (Signed)
ED Provider at bedside. 

## 2016-04-19 NOTE — ED Notes (Signed)
Pt's sister (POA) reports pt has fallen 3 times today. [facility] Pt reports R hip pain, L knee wound and R elbow bruising and abrasions.

## 2016-04-19 NOTE — ED Notes (Signed)
Back from CT. Alert, NAD, calm, interactive, resps e/u, speaking in clear sentences, no dyspnea noted, skin W&D, VSS/ improved, rates pain 8/10, (denies: sob, nausea, dizziness or visual changes). [facility] here.

## 2016-04-19 NOTE — ED Provider Notes (Signed)
[facility] Provider Note   CSN: [id number] Arrival date & time: 04/19/16  1539   By signing my name below, I, Ricardo Hendricks, attest that this documentation has been prepared under the direction and in the presence of Geoffery Lyons, MD . Electronically Signed: Teofilo Hendricks, ED Scribe. 04/19/2016. 4:03 PM.   History   Chief Complaint Chief Complaint  Patient presents with  . Fall    The history is provided by the patient and a relative. No language interpreter was used.   HPI Comments:  Ricardo Hendricks is a 66 y.o. male with PMHx of a-fib, bipolar 1 and depression who presents to the Emergency Department s/p fall that occurred PTA. Sister reports that pt slipped at home and fell unwitnessed. Pt lives at assisted living, and his sister reports that she came to help him shower today, and he was on the ground and had likely been there for 2 hours. Pt complains of pain to the right hip. She states that pt stays in bed most of the day. Pt then rolled out of the bed on to the floor. Sister reports that pt sustained wounds to his right elbow and left knee. Pt was seen at [facility] for pneumonia last month. Pt left skilled nursing last week. Pt notes that urine and BM has been normal, and sister states that he does not eat or drink much normally. No alleviating factors noted. Pt denies abdominal pain.   Past Medical History:  Diagnosis Date  . A-fib (HCC)   . Bipolar 1 disorder (HCC)   . Gout   . Hernia of testicle   . Hyperlipemia   . Hypertension     Patient Active Problem List   Diagnosis Date Noted  . Protein-calorie malnutrition, severe 03/19/2016  . HCAP (healthcare-associated pneumonia)   . AKI (acute kidney injury) (HCC)   . Sepsis (HCC) 03/17/2016  . Pressure injury of skin 03/17/2016    Past Surgical History:  Procedure Laterality Date  . BILATERAL HEMI SHOULDER ARTHROPLASTY    . CORONARY ARTERY BYPASS GRAFT    . KNEE ARTHROSCOPY    . TOTAL HIP  ARTHROPLASTY         Home Medications    Prior to Admission medications   Medication Sig Start Date End Date Taking? Authorizing Provider  acetaminophen (TYLENOL) 650 MG CR tablet Take 1,300 mg by mouth every 8 (eight) hours as needed for pain.    Historical Provider, MD  allopurinol (ZYLOPRIM) 300 MG tablet Take 300 mg by mouth daily.    Historical Provider, MD  aspirin EC 81 MG tablet Take 81 mg by mouth daily.    Historical Provider, MD  bisacodyl (DULCOLAX) 10 MG suppository Place 1 suppository (10 mg total) rectally daily as needed for moderate constipation. 03/23/16   [name], DO  cefUROXime (CEFTIN) 250 MG tablet Take 1 tablet (250 mg total) by mouth 2 (two) times daily with a meal. 03/22/16   [name], DO  clonazePAM (KLONOPIN) 1 MG tablet Take 1 tablet (1 mg total) by mouth 3 (three) times daily. 03/23/16   [name], DO  diltiazem (DILTIAZEM CD) 240 MG 24 hr capsule Take 240 mg by mouth daily.    Historical Provider, MD  docusate sodium (COLACE) 100 MG capsule Take 100 mg by mouth at bedtime.     Historical Provider, MD  feeding supplement, ENSURE ENLIVE, (ENSURE ENLIVE) LIQD Take 237 mLs by mouth 2 (two) times daily between meals. 03/22/16   Maryann  Mikhail, DO  meloxicam (MOBIC) 15 MG tablet Take 15 mg by mouth daily.    Historical Provider, MD  mirtazapine (REMERON) 45 MG tablet Take 45 mg by mouth at bedtime.     Historical Provider, MD  Multiple Vitamin (MULTIVITAMIN) capsule Take 1 capsule by mouth daily.    Historical Provider, MD  polyethylene glycol (MIRALAX / GLYCOLAX) packet Take 17 g by mouth daily.    Historical Provider, MD  pravastatin (PRAVACHOL) 40 MG tablet Take 40 mg by mouth daily.    Historical Provider, MD  rivaroxaban (XARELTO) 20 MG TABS tablet Take 20 mg by mouth daily with supper.    Historical Provider, MD    Family History No family history on file.  Social History Social History  Substance Use Topics  . Smoking status: Never  Smoker  . Smokeless tobacco: Never Used  . Alcohol use No     Allergies   Patient has no known allergies.   Review of Systems Review of Systems  Gastrointestinal: Negative for abdominal pain.  Musculoskeletal: Positive for arthralgias.  Skin: Positive for wound.     Physical Exam Updated Vital Signs BP 98/65 (BP Location: Left Arm)   Pulse 95   Temp 97.6 F (36.4 C) (Oral)   Resp 20   Ht 5\' 8"  (1.727 m)   Wt 115 lb (52.2 kg)   SpO2 97%   BMI 17.49 kg/m   Physical Exam  Constitutional: He appears well-developed and well-nourished. No distress.  Appears chronically ill and is somewhat cachectic.   HENT:  Head: Normocephalic and atraumatic.  Mucous membranes somewhat dry.   Eyes: Conjunctivae are normal.  Neck: Normal range of motion. Neck supple.  Cardiovascular:  Irregularly irregular and rapid.  Pulmonary/Chest: Effort normal and breath sounds normal. No respiratory distress. He has no wheezes. He has no rales.  Abdominal: Soft. Bowel sounds are normal. He exhibits no distension. There is no tenderness.  Musculoskeletal:  Abrasions and some swelling to the left knee.   Right hip appears grossly normal. No shortening or rotation of leg. No pain with ROM.   Neurological: He is alert.  Skin: Skin is warm and dry.  Psychiatric: He has a normal mood and affect.  Nursing note and vitals reviewed.    ED Treatments / Results  DIAGNOSTIC STUDIES:  Oxygen Saturation is 94% on RA, adequate by my interpretation.    COORDINATION OF CARE:  3:59 PM Will order blood work and IV fluids. Discussed treatment plan with pt at bedside and pt agreed to plan.   Labs (all labs ordered are listed, but only abnormal results are displayed) Labs Reviewed - No data to display  EKG  EKG Interpretation None       Radiology No results found.  Procedures Procedures (including critical care time)  Medications Ordered in ED Medications - No data to display   Initial  Impression / Assessment and Plan / ED Course  I have reviewed the triage vital signs and the nursing notes.  Pertinent labs & imaging results that were available during my care of the patient were reviewed by me and considered in my medical decision making (see chart for details).  Patient presents here after sustaining multiple falls today. He was recently hospitalized for pneumonia. Today's workup reveals an apparent periprosthetic fracture of the right hip. He also has a significant elevated white count, however I am uncertain the etiology of this. He is afebrile and does not appear toxic. Chest x-ray is clear and  urinalysis does not show a UTI. I spoke with Dr. Robb Matarrtiz who agrees to admit the patient. I've also spoke with Dr. Aundria Rudogers from orthopedics who will consult on the hip once the patient has been admitted.  Final Clinical Impressions(s) / ED Diagnoses   Final diagnoses:  None    New Prescriptions New Prescriptions   No medications on file  I personally performed the services described in this documentation, which was scribed in my presence. The recorded information has been reviewed and is accurate.        Geoffery Lyonsouglas Hannah Crill, MD 04/19/16 [phone]

## 2016-04-19 NOTE — H&P (Addendum)
History and Physical    Ricardo Hendricks WGN:[id number] DOB: 01-19-1951 DOA: 04/19/2016  Referring MD/NP/PA:   PCP: Ricardo Lights, MD   Patient coming from:  The patient is coming from assisted living.  At baseline, pt is dependent for most of ADL.       Chief Complaint: right hip pain after fall  HPI: Ricardo Hendricks is a 66 y.o. male with medical history significant of hypertension, hyperlipidemia, gout, depression with anxiety, atrial fibrillation on Xarelto, hernia of testicle, CAD, CABG, who presents with right hip pain after fall.  Patient states that she slipped and fell when he bent over to pick up something, injured his right hip. He also has small skin tear in the left kidney and right elbow. He did not lose consciousness. He strongly denies any head and neck injury. No neck or head pain. Patient states that his right hip pain is constant, 8 out of 10 in severity, nonradiating. It is aggravated by movement. He does not have leg numbness. Per report, pt was on the floor for about 2 hours before he was helped up. Patient states that he has mild cough, no chest pain or SOB. He was recently treated for pneumonia and discharged to rehabilitation facility, just left rehabilitation last week. Currently he is living in assisted living facility. Patient does not have a nausea, vomiting, diarrhea, abdominal pain or symptoms of UTI. No unilateral weakness. Patient had hypotension with blood pressure 74/56 which improved to 107/81 after 1.5 L normal saline bolus.  ED Course: pt was found to have WBC 23.8, hemoglobin 12.8, positive urinalysis with small amount of leukocyte, electrolytes renal function okay, temperature normal, O2 sats saturation 100% on room air, negative chest x-ray, negative x-ray of left knee joint. X-ray of Femur, R-hip/pelvis showed right hip fracture which is further confirmed by CT-scan. Pt is admitted to tele bed as inpt. Ortho, Dr. Aundria Rud was consulted by EDP.  # CT-right hip  showed minimally displaced periprosthetic fracture about right hip prosthesis involving the proximal femoral stem, lesser and greater trochanters. Heterogeneous enlargement of the iliopsoas and gluteal musculature likely hemorrhage/hematoma in the setting of acute injury.   Review of Systems:   General: no fevers, chills, no changes in body weight, has fatigue HEENT: no blurry vision, hearing changes or sore throat Respiratory: no dyspnea, has coughing, no wheezing CV: no chest pain, no palpitations GI: no nausea, vomiting, abdominal pain, diarrhea, constipation GU: no dysuria, burning on urination, increased urinary frequency, hematuria  Ext: no leg edema Neuro: no unilateral weakness, numbness, or tingling, no vision change or hearing loss Skin: no rash. Has skin tear in right elbow and left knee MSK: has right hip pain. Heme: No easy bruising.  Travel history: No recent long distant travel.  Allergy: No Known Allergies  Past Medical History:  Diagnosis Date  . A-fib (HCC)   . Bipolar 1 disorder (HCC)   . Gout   . Hernia of testicle   . Hyperlipemia   . Hypertension     Past Surgical History:  Procedure Laterality Date  . BILATERAL HEMI SHOULDER ARTHROPLASTY    . CORONARY ARTERY BYPASS GRAFT    . KNEE ARTHROSCOPY    . TOTAL HIP ARTHROPLASTY      Social History:  reports that he has never smoked. He has never used smokeless tobacco. He reports that he does not drink alcohol or use drugs.  Family History:  Family History  Problem Relation Age of Onset  . Heart attack Mother   .  Heart attack Father   . Heart attack Brother      Prior to Admission medications   Medication Sig Start Date End Date Taking? Authorizing Provider  acetaminophen (TYLENOL) 500 MG tablet Take 500 mg by mouth at bedtime.   Yes Historical Provider, MD  allopurinol (ZYLOPRIM) 300 MG tablet Take 300 mg by mouth daily.   Yes Historical Provider, MD  aspirin EC 81 MG tablet Take 81 mg by mouth  daily.   Yes Historical Provider, MD  bisacodyl (DULCOLAX) 10 MG suppository Place 1 suppository (10 mg total) rectally daily as needed for moderate constipation. 03/23/16  Yes [name], DO  clonazePAM (KLONOPIN) 1 MG tablet Take 1 tablet (1 mg total) by mouth 3 (three) times daily. 03/23/16  Yes [name], DO  diltiazem (DILACOR XR) 180 MG 24 hr capsule Take 180 mg by mouth daily.   Yes Historical Provider, MD  docusate sodium (COLACE) 100 MG capsule Take 100 mg by mouth at bedtime.    Yes Historical Provider, MD  feeding supplement, ENSURE ENLIVE, (ENSURE ENLIVE) LIQD Take 237 mLs by mouth 2 (two) times daily between meals. 03/22/16  Yes [name], DO  meloxicam (MOBIC) 7.5 MG tablet Take 7.5 mg by mouth at bedtime as needed for pain.   Yes Historical Provider, MD  Multiple Vitamin (MULTIVITAMIN) capsule Take 1 capsule by mouth daily.   Yes Historical Provider, MD  polyethylene glycol (MIRALAX / GLYCOLAX) packet Take 17 g by mouth daily.   Yes Historical Provider, MD  pravastatin (PRAVACHOL) 40 MG tablet Take 40 mg by mouth daily.   Yes Historical Provider, MD  rivaroxaban (XARELTO) 20 MG TABS tablet Take 20 mg by mouth daily with supper.   Yes Historical Provider, MD  acetaminophen (TYLENOL) 650 MG CR tablet Take 1,300 mg by mouth every 8 (eight) hours as needed for pain.    Historical Provider, MD  cefUROXime (CEFTIN) 250 MG tablet Take 1 tablet (250 mg total) by mouth 2 (two) times daily with a meal. 03/22/16   [name], DO  diltiazem (DILTIAZEM CD) 240 MG 24 hr capsule Take 240 mg by mouth daily.    Historical Provider, MD  meloxicam (MOBIC) 15 MG tablet Take 15 mg by mouth daily.    Historical Provider, MD  mirtazapine (REMERON) 45 MG tablet Take 45 mg by mouth at bedtime.     Historical Provider, MD    Physical Exam: Vitals:   04/19/16 2144 04/19/16 2145 04/19/16 2149 04/19/16 2243  BP:  96/71 100/70 107/81  Pulse:  78 (!) 57 (!) 114  Resp:    18  Temp: 98.4 F  (36.9 C)   98.4 F (36.9 C)  TempSrc: Oral   Oral  SpO2:  98% 98% 100%  Weight:      Height:       General: Not in acute distress HEENT:       Eyes: PERRL, EOMI, no scleral icterus.       ENT: No discharge from the ears and nose, no pharynx injection, no tonsillar enlargement.        Neck: No JVD, no bruit, no mass felt. Heme: No neck lymph node enlargement. Cardiac: S1/S2, RRR, No murmurs, No gallops or rubs. Respiratory: No rales, wheezing, rhonchi or rubs. GI: Soft, nondistended, nontender, no rebound pain, no organomegaly, BS present. GU: No hematuria Ext: No pitting leg edema bilaterally. 2+DP/PT pulse bilaterally. Musculoskeletal: has right hip tenderness Skin: No rashes. Has small skin tear in left knee and  right elbow Neuro: Alert, oriented X3, cranial nerves II-XII grossly intact, moves all extremities. Psych: Patient is not psychotic, no suicidal or hemocidal ideation.  Labs on Admission: I have personally reviewed following labs and imaging studies  CBC:  Recent Labs Lab 04/19/16 1600 04/20/16 0028 04/20/16 0305  WBC 23.8* 13.8* 14.3*  NEUTROABS 22.4*  --   --   HGB 12.8* 9.5* 9.1*  HCT 38.9* 29.1* 28.4*  MCV 93.5 94.5 94.0  PLT 262 183 197   Basic Metabolic Panel:  Recent Labs Lab 04/19/16 1600  NA 141  K 4.6  CL 102  CO2 25  GLUCOSE 142*  BUN 20  CREATININE 1.07  CALCIUM 9.3   GFR: Estimated Creatinine Clearance: 50.1 mL/min (by C-G formula based on SCr of 1.07 mg/dL). Liver Function Tests:  Recent Labs Lab 04/19/16 1600  AST 54*  ALT 17  ALKPHOS 120  BILITOT 0.6  PROT 6.6  ALBUMIN 3.3*   No results for input(s): LIPASE, AMYLASE in the last 168 hours. No results for input(s): AMMONIA in the last 168 hours. Coagulation Profile:  Recent Labs Lab 04/20/16 0028  INR 1.56   Cardiac Enzymes:  Recent Labs Lab 04/19/16 1600  CKTOTAL 150   BNP (last 3 results) No results for input(s): PROBNP in the last 8760 hours. HbA1C: No  results for input(s): HGBA1C in the last 72 hours. CBG: No results for input(s): GLUCAP in the last 168 hours. Lipid Profile: No results for input(s): CHOL, HDL, LDLCALC, TRIG, CHOLHDL, LDLDIRECT in the last 72 hours. Thyroid Function Tests: No results for input(s): TSH, T4TOTAL, FREET4, T3FREE, THYROIDAB in the last 72 hours. Anemia Panel: No results for input(s): VITAMINB12, FOLATE, FERRITIN, TIBC, IRON, RETICCTPCT in the last 72 hours. Urine analysis:    Component Value Date/Time   COLORURINE AMBER (A) 04/19/2016 1755   APPEARANCEUR CLOUDY (A) 04/19/2016 1755   LABSPEC 1.018 04/19/2016 1755   PHURINE 7.0 04/19/2016 1755   GLUCOSEU NEGATIVE 04/19/2016 1755   HGBUR LARGE (A) 04/19/2016 1755   BILIRUBINUR NEGATIVE 04/19/2016 1755   KETONESUR NEGATIVE 04/19/2016 1755   PROTEINUR NEGATIVE 04/19/2016 1755   NITRITE NEGATIVE 04/19/2016 1755   LEUKOCYTESUR SMALL (A) 04/19/2016 1755   Sepsis Labs: @LABRCNTIP (procalcitonin:4,lacticidven:4) ) Recent Results (from the past 240 hour(s))  Surgical pcr screen     Status: None   Collection Time: 04/20/16  2:00 AM  Result Value Ref Range Status   MRSA, PCR NEGATIVE NEGATIVE Final   Staphylococcus aureus NEGATIVE NEGATIVE Final    Comment:        The Xpert SA Assay (FDA approved for NASAL specimens in patients over 521 years of age), is one component of a comprehensive surveillance program.  Test performance has been validated by [facility] for patients greater than or equal to 66 year old. It is not intended to diagnose infection nor to guide or monitor treatment.      Radiological Exams on Admission: Dg Chest 2 View  Result Date: 04/19/2016 CLINICAL DATA:  Weakness, un- witnessed fall at home EXAM: CHEST  2 VIEW COMPARISON:  03/18/2016 FINDINGS: Cardiomediastinal silhouette is stable. Hyperinflation again noted. Status post median sternotomy. No infiltrate or pulmonary edema. Osteopenia and degenerative changes thoracic spine.  IMPRESSION: No active disease. Hyperinflation. Status post median sternotomy. Osteopenia and degenerative changes thoracic spine. Electronically Signed   By: Natasha MeadLiviu  Pop M.D.   On: 04/19/2016 17:05   Ct Hip Right Wo Contrast  Result Date: 04/19/2016 CLINICAL DATA:  Right hip fracture, unwitnessed  fall this afternoon. Closed fracture, initial encounter. EXAM: CT OF THE RIGHT HIP WITHOUT CONTRAST TECHNIQUE: Multidetector CT imaging of the right hip was performed according to the standard protocol. Multiplanar CT image reconstructions were also generated. COMPARISON:  Radiographs earlier this day. Reformats from abdominal CT 12/11/2015 FINDINGS: Bones/Joint/Cartilage Right hip total arthroplasty in expected alignment. Periprosthetic fracture involves the proximal femoral stem, lesser and greater trochanters. Fracture is minimally displaced. No significant angulation. No additional fracture of the right hemipelvis. Pubic rami are intact. Right sacroiliac joint and pubic symphysis are congruent. Ligaments Suboptimally assessed by CT. Muscles and Tendons Edematous heterogeneous appearance of the right iliopsoas and gluteal musculature, likely hematoma in the setting of acute injury. Soft tissues Dense vascular calcifications. Diffuse soft tissue edema. Paucity of body wall fat can be seen with cachexia. IMPRESSION: Minimally displaced periprosthetic fracture about right hip prosthesis involving the proximal femoral stem, lesser and greater trochanters. Heterogeneous enlargement of the iliopsoas and gluteal musculature likely hemorrhage/hematoma in the setting of acute injury. Electronically Signed   By: Rubye Oaks M.D.   On: 04/19/2016 22:16   Dg Knee Complete 4 Views Left  Result Date: 04/19/2016 CLINICAL DATA:  Status post fall at home, weakness EXAM: LEFT KNEE - COMPLETE 4+ VIEW COMPARISON:  None. FINDINGS: Four views of the left knee submitted. No acute fracture or subluxation. Postsurgical changes with 2  wood transverse metallic fixation screws are noted proximal tibia. Diffuse osteopenia is noted. No acute fracture or subluxation. There is diffuse narrowing of joint space. Narrowing of patellofemoral joint space. Tiny joint effusion. IMPRESSION: No acute fracture or subluxation. Diffuse osteopenia. Osteoarthritic changes as described above. Spot tiny joint effusion. Postsurgical changes with metallic fixation material proximal left tibia. Electronically Signed   By: Natasha Mead M.D.   On: 04/19/2016 17:07   Dg Hip Unilat W Or Wo Pelvis 2-3 Views Right  Result Date: 04/19/2016 CLINICAL DATA:  Pain after fall. EXAM: DG HIP (WITH OR WITHOUT PELVIS) 2-3V RIGHT COMPARISON:  December 11, 2015 CT scan FINDINGS: The patient is status post right hip replacement. The acetabular component is in good position as is the femoral component. There is a step-off/contour abnormality inferior to the right greater trochanter suggesting acute fracture. Subtle step-off in the region of the inferior trochanter. No other fractures are identified. IMPRESSION: The findings are most consistent with an acute fracture seen just inferior to the right greater trochanter and medially at the level of the inferior trochanter. Electronically Signed   By: Gerome Sam III M.D   On: 04/19/2016 17:11   Dg Femur Min 2 Views Right  Result Date: 04/19/2016 CLINICAL DATA:  Fall today. Confirmed right hip fx on earlier films. EXAM: RIGHT FEMUR 2 VIEWS COMPARISON:  Right hip same day FINDINGS: Three views of the right femur submitted. Study is limited by diffuse osteopenia. Right hip prosthesis partially visualized. No distal femoral fracture is noted. Again noted nondisplaced fracture in proximal right femur just inferior to greater femoral trochanter region. IMPRESSION: Study is limited by diffuse osteopenia. Right hip prosthesis partially visualized. No distal femoral fracture is noted. Again noted nondisplaced fracture in proximal right femur  just inferior to greater femoral trochanter region. Electronically Signed   By: Natasha Mead M.D.   On: 04/19/2016 18:55     EKG: Not done in ED, will get one.   Assessment/Plan Principal Problem:   Periprosthetic hip fracture Active Problems:   Essential hypertension   HLD (hyperlipidemia)   Gout   Depression with  anxiety   Atrial fibrillation, chronic (HCC)   CAD (coronary artery disease)   Hypotension   Leukocytosis   Closed hip fracture (HCC)   Fall   Anemia   Fall and Periprosthetic R hip fracture: CT-right hip showed minimally displaced periprosthetic fracture about right hip prosthesis involving the proximal femoral stem, lesser and greater trochanters. Pt had mechanic fall. No LOC. He strongly denies any head and neck injury. He refused to do CT-head and neck. No neurovascular compromise. Orthopedic surgeon, dr. Aundria Rud was consulted-->planing to do surgery in AM./  - will admit to tele be as inpt - Pain control: morphine prn and percocet - When necessary Zofran for nausea - Robaxin for muscle spasm - f/u Dr. Doreene Adas recommendations - type and cross - INR/PTT and type screen.  Leukocytosis: Likely due to stress-induced demargination. Patient does not have fever. CXR negative. UA has small amount of leukocyte, but patient denies symptoms of UTI. -Follow-up CBC -get Bx and Ux.  Hypotension: pt had hypotension with blood pressure 74/50, which improved to 107/81 after 1.5 L normal saline daily. Etiology is not clear. Patient likely to have large amount of internal bleeding. CT of her right hip showed heterogeneous enlargement of the iliopsoas and gluteal musculature likely hemorrhage/hematoma. Hgb dropped from 12.8-->9.5-->9.2. Currently hemodynamically stable. -will check CBC q6h. Will transfuse with blood if Hgb<7.0 -check FOBT -hold ASA  Anemia: Hgb dropped from 12.8-->9.5-->9.5. Likely due to internal bleeding as above -see above  HTN: -pt is on Cardizem which is  also for Afib (put in SBP holding parameter)  Gout: -continue home allopurinol   HLD:  -Continue home medications: Pravastatin  Depression and anxiety: Stable, no suicidal or homicidal ideations. -Continue home medications: clonopin and Remeron  Atrial Fibrillation: CHA2DS2-VASc Score is 4, needs oral anticoagulation. Patient is on Xarelto at home. Heart rate is 100-110s -will hold Xarelto -continue Cardizem  CAD (coronary artery disease): s/p of CABG. No CP -continue pravastatin -hold ASA due to possible bleeding in iliopsoas and gluteal musculature.  Skin tear: has small skin tear in right elbow and left knee -consulted wound care.   DVT ppx: SCD Code Status: Full code Family Communication: None at bed side. Disposition Plan:  Anticipate discharge back to rehabilitation facility  Consults called: orhto, Dr. Aundria Rud Admission status:  Inpatient/tele    Date of Service 04/20/2016    Lorretta Harp [facility] Pager [phone]  If 7PM-7AM, please contact night-coverage [email or web address] Password [email or web address] 04/20/2016, 5:01 AM

## 2016-04-19 NOTE — Plan of Care (Signed)
Accepted to telemetry bed as an inpatient for right femur fractured. He will need pain control and medical management. Dr. Aundria Rud from Orthopedic surgery was consulted by Dr. Judd Lien.  Per Dr. Geoffery Lyons at [facility]. HPI Comments:  Ricardo Hendricks is a 66 y.o. male with PMHx of a-fib, bipolar 1 and depression who presents to the Emergency Department s/p fall that occurred PTA. Sister reports that pt slipped at home and fell unwitnessed. Pt lives at assisted living, and his sister reports that she came to help him shower today, and he was on the ground and had likely been there for 2 hours. Pt complains of pain to the right hip. She states that pt stays in bed most of the day. Pt then rolled out of the bed on to the floor. Sister reports that pt sustained wounds to his right elbow and left knee. Pt was [facility] at [facility] for pneumonia last month. Pt left skilled nursing last week. Pt notes that urine and BM has been normal, and sister states that he does not eat or drink much normally. No alleviating factors noted. Pt denies abdominal pain.   Urinalysis, Microscopic (reflex) [[id number]] (Abnormal) Collected: 04/19/16 1755  Updated: 04/19/16 1848    RBC / HPF TOO NUMEROUS TO COUNT RBC/hpf   WBC, UA 0-5 WBC/hpf   Bacteria, UA FEW (A)   Squamous Epithelial / LPF 0-5 (A)   Mucous PRESENT  Urinalysis, Routine w reflex microscopic [[id number]] (Abnormal) Collected: 04/19/16 1755  Updated: 04/19/16 1848   Specimen Type: Urine   Specimen Source: Urine, Catheterized    Color, Urine AMBER (A)   APPearance CLOUDY (A)   Specific Gravity, Urine 1.018   pH 7.0   Glucose, UA NEGATIVE mg/dL   Hgb urine dipstick LARGE (A)   Bilirubin Urine NEGATIVE   Ketones, ur NEGATIVE mg/dL   Protein, ur NEGATIVE mg/dL   Nitrite NEGATIVE   Leukocytes, UA SMALL (A)  Comprehensive metabolic panel [[id number]] (Abnormal) Collected: 04/19/16 1600  Updated: 04/19/16 1625   Specimen Type: Blood   Specimen Source: Vein    Sodium 141  mmol/L   Potassium 4.6 mmol/L   Chloride 102 mmol/L   CO2 25 mmol/L   Glucose, Bld 142 (H) mg/dL   BUN 20 mg/dL   Creatinine, Ser 5.28 mg/dL   Calcium 9.3 mg/dL   Total Protein 6.6 g/dL   Albumin 3.3 (L) g/dL   AST 54 (H) U/L   ALT 17 U/L   Alkaline Phosphatase 120 U/L   Total Bilirubin 0.6 mg/dL   GFR calc non Af Amer >60 mL/min   GFR calc Af Amer >60 mL/min   Anion gap 14  CK [[id number]] Collected: 04/19/16 1600  Updated: 04/19/16 1625   Specimen Type: Blood   Specimen Source: Vein    Total CK 150 U/L  CBC with Differential [[id number]] (Abnormal) Collected: 04/19/16 1600  Updated: 04/19/16 1612   Specimen Type: Blood   Specimen Source: Vein    WBC 23.8 (H) K/uL   RBC 4.16 (L) MIL/uL   Hemoglobin 12.8 (L) g/dL   HCT 03.4 (L) %   MCV 93.5 fL   MCH 30.8 pg   MCHC 32.9 g/dL   RDW 74.2 %   Platelets 262 K/uL   Neutrophils Relative % 94 %   Neutro Abs 22.4 (H) K/uL   Lymphocytes Relative 3 %   Lymphs Abs 0.8 K/uL   Monocytes Relative 3 %   Monocytes Absolute 0.6 K/uL   Eosinophils Relative 0 %  Eosinophils Absolute 0.0 K/uL   Basophils Relative 0 %   Basophils Absolute 0.0 K/uL   EKG Vent. rate 124 BPM PR interval * ms QRS duration 87 ms QT/QTc 296/426 ms P-R-T axes * 79 259 Atrial fibrillation Ventricular premature complex Anteroseptal infarct, old Nonspecific T abnormalities, lateral leads -----------------------------------------------------------------------------------------------------------------  EXAM:DG HIP (WITH OR WITHOUT PELVIS) 2-3V RIGHT CLINICAL DATA:  Pain after fall. COMPARISON:  December 11, 2015 CT scan  FINDINGS: The patient is status post right hip replacement. The acetabular component is in good position as is the femoral component. There is a step-off/contour abnormality inferior to the right greater trochanter suggesting acute fracture. Subtle step-off in the region of the inferior trochanter. No other fractures are  identified.  IMPRESSION: The findings are most consistent with an acute fracture seen just inferior to the right greater trochanter and medially at the level of the inferior trochanter.  Electronically Signed   By: Natasha MeadLiviu  Pop M.D.   On: 04/19/2016 17:05  --------------------------------------------------------------------------------------------------------------- Sanda Kleinavid Ortiz, MD [phone].

## 2016-04-19 NOTE — ED Notes (Addendum)
Attempt[facility] [id number][facility]

## 2016-04-19 NOTE — ED Notes (Addendum)
Pt states his hip is starting to become painful and pt appears to be getting restless in bed.  Awaiting [facility] transport to inpatient unit.

## 2016-04-20 ENCOUNTER — Encounter (HOSPITAL_COMMUNITY): Payer: Self-pay | Admitting: Internal Medicine

## 2016-04-20 DIAGNOSIS — I25810 Atherosclerosis of coronary artery bypass graft(s) without angina pectoris: Secondary | ICD-10-CM

## 2016-04-20 DIAGNOSIS — L89512 Pressure ulcer of right ankle, stage 2: Secondary | ICD-10-CM | POA: Diagnosis present

## 2016-04-20 DIAGNOSIS — D62 Acute posthemorrhagic anemia: Secondary | ICD-10-CM

## 2016-04-20 DIAGNOSIS — I959 Hypotension, unspecified: Secondary | ICD-10-CM

## 2016-04-20 DIAGNOSIS — M9701XA Periprosthetic fracture around internal prosthetic right hip joint, initial encounter: Principal | ICD-10-CM

## 2016-04-20 DIAGNOSIS — S72001A Fracture of unspecified part of neck of right femur, initial encounter for closed fracture: Secondary | ICD-10-CM

## 2016-04-20 DIAGNOSIS — Z515 Encounter for palliative care: Secondary | ICD-10-CM

## 2016-04-20 DIAGNOSIS — R627 Adult failure to thrive: Secondary | ICD-10-CM

## 2016-04-20 DIAGNOSIS — Z66 Do not resuscitate: Secondary | ICD-10-CM

## 2016-04-20 DIAGNOSIS — D649 Anemia, unspecified: Secondary | ICD-10-CM | POA: Diagnosis present

## 2016-04-20 DIAGNOSIS — W19XXXA Unspecified fall, initial encounter: Secondary | ICD-10-CM | POA: Diagnosis present

## 2016-04-20 DIAGNOSIS — M1A9XX0 Chronic gout, unspecified, without tophus (tophi): Secondary | ICD-10-CM

## 2016-04-20 LAB — CBC
HCT: 28.4 % — ABNORMAL LOW (ref 39.0–52.0)
HCT: 27.7 % — ABNORMAL LOW (ref 39.0–52.0)
HEMATOCRIT: 23.2 % — AB (ref 39.0–52.0)
HEMATOCRIT: 26.7 % — AB (ref 39.0–52.0)
HEMATOCRIT: 29.1 % — AB (ref 39.0–52.0)
HEMOGLOBIN: 8.6 g/dL — AB (ref 13.0–17.0)
Hemoglobin: 7.6 g/dL — ABNORMAL LOW (ref 13.0–17.0)
Hemoglobin: 9 g/dL — ABNORMAL LOW (ref 13.0–17.0)
Hemoglobin: 9.1 g/dL — ABNORMAL LOW (ref 13.0–17.0)
Hemoglobin: 9.5 g/dL — ABNORMAL LOW (ref 13.0–17.0)
MCH: 30.1 pg (ref 26.0–34.0)
MCH: 30.3 pg (ref 26.0–34.0)
MCH: 30.6 pg (ref 26.0–34.0)
MCH: 30.8 pg (ref 26.0–34.0)
MCH: 30.8 pg (ref 26.0–34.0)
MCHC: 32 g/dL (ref 30.0–36.0)
MCHC: 32.2 g/dL (ref 30.0–36.0)
MCHC: 32.5 g/dL (ref 30.0–36.0)
MCHC: 32.6 g/dL (ref 30.0–36.0)
MCHC: 32.8 g/dL (ref 30.0–36.0)
MCV: 93.9 fL (ref 78.0–100.0)
MCV: 94 fL (ref 78.0–100.0)
MCV: 94 fL (ref 78.0–100.0)
MCV: 94.2 fL (ref 78.0–100.0)
MCV: 94.5 fL (ref 78.0–100.0)
PLATELETS: 197 10*3/uL (ref 150–400)
PLATELETS: 195 10*3/uL (ref 150–400)
PLATELETS: 155 10*3/uL (ref 150–400)
Platelets: 183 10*3/uL (ref 150–400)
Platelets: 192 10*3/uL (ref 150–400)
RBC: 2.47 MIL/uL — ABNORMAL LOW (ref 4.22–5.81)
RBC: 2.84 MIL/uL — ABNORMAL LOW (ref 4.22–5.81)
RBC: 2.94 MIL/uL — AB (ref 4.22–5.81)
RBC: 3.02 MIL/uL — AB (ref 4.22–5.81)
RBC: 3.08 MIL/uL — ABNORMAL LOW (ref 4.22–5.81)
RDW: 13.3 % (ref 11.5–15.5)
RDW: 13.6 % (ref 11.5–15.5)
RDW: 13.6 % (ref 11.5–15.5)
RDW: 13.7 % (ref 11.5–15.5)
RDW: 13.8 % (ref 11.5–15.5)
WBC: 11.5 10*3/uL — ABNORMAL HIGH (ref 4.0–10.5)
WBC: 12.7 10*3/uL — ABNORMAL HIGH (ref 4.0–10.5)
WBC: 13.4 10*3/uL — ABNORMAL HIGH (ref 4.0–10.5)
WBC: 13.8 10*3/uL — ABNORMAL HIGH (ref 4.0–10.5)
WBC: 14.3 10*3/uL — AB (ref 4.0–10.5)

## 2016-04-20 LAB — PROTIME-INR
INR: 1.56
PROTHROMBIN TIME: 18.9 s — AB (ref 11.4–15.2)

## 2016-04-20 LAB — SURGICAL PCR SCREEN
MRSA, PCR: NEGATIVE
Staphylococcus aureus: NEGATIVE

## 2016-04-20 LAB — ABO/RH: ABO/RH(D): O POS

## 2016-04-20 LAB — APTT: aPTT: 40 seconds — ABNORMAL HIGH (ref 24–36)

## 2016-04-20 MED ORDER — ENSURE ENLIVE PO LIQD
237.0000 mL | Freq: Three times a day (TID) | ORAL | Status: DC
  Administered 2016-04-20 – 2016-04-24 (×11): 237 mL via ORAL

## 2016-04-20 MED ORDER — CLONAZEPAM 1 MG PO TABS: 1.0000 mg | ORAL_TABLET | Freq: Three times a day (TID) | ORAL | 0 refills | Status: AC

## 2016-04-20 NOTE — Consult Note (Signed)
ORTHOPAEDIC CONSULTATION  REQUESTING PHYSICIAN: Narda Bonds, MD  PCP:  [name], MD  Chief Complaint: Right hip pain following several falls  HPI: Ricardo Hendricks is a 66 y.o. male who complains of right hip pain following ground level falls yesterday.  The patient is a poor historian and much of the HPI is obtained from chart review and discussion with his POA, his sister.  She states that he lives in an assisted living facility and is supposed to use a walker at baseline, that he refuses to use however.  His medical history significant of hypertension, hyperlipidemia, gout, depression with anxiety, atrial fibrillation on Xarelto, hernia of testicle, CAD, CABG, who presents with right hip pain after fall.  [facility] and noted to have a periprosthetic fracture of the right total hip around the stem and we elected to have him moved to [facility] for further evaluation with CT scan and work on pain control and placement.  Past Medical History:  Diagnosis Date  . A-fib (HCC)   . Bipolar 1 disorder (HCC)   . Gout   . Hernia of testicle   . Hyperlipemia   . Hypertension    Past Surgical History:  Procedure Laterality Date  . BILATERAL HEMI SHOULDER ARTHROPLASTY    . CORONARY ARTERY BYPASS GRAFT    . KNEE ARTHROSCOPY    . TOTAL HIP ARTHROPLASTY     Social History   Social History  . Marital status: Unknown    Spouse name: N/A  . Number of children: N/A  . Years of education: N/A   Social History Main Topics  . Smoking status: Never Smoker  . Smokeless tobacco: Never Used  . Alcohol use No  . Drug use: No  . Sexual activity: Not Asked   Other Topics Concern  . None   Social History Narrative  . None   Family History  Problem Relation Age of Onset  . Heart attack Mother   . Heart attack Father   . Heart attack Brother    No Known Allergies Prior to Admission medications   Medication Sig Start Date End Date Taking? Authorizing Provider    acetaminophen (TYLENOL) 500 MG tablet Take 500 mg by mouth at bedtime.   Yes Historical Provider, MD  allopurinol (ZYLOPRIM) 300 MG tablet Take 300 mg by mouth daily.   Yes Historical Provider, MD  aspirin EC 81 MG tablet Take 81 mg by mouth daily.   Yes Historical Provider, MD  bisacodyl (DULCOLAX) 10 MG suppository Place 1 suppository (10 mg total) rectally daily as needed for moderate constipation. 03/23/16  Yes [name], DO  clonazePAM (KLONOPIN) 1 MG tablet Take 1 tablet (1 mg total) by mouth 3 (three) times daily. 03/23/16  Yes [name], DO  diltiazem (DILACOR XR) 180 MG 24 hr capsule Take 180 mg by mouth daily.   Yes Historical Provider, MD  docusate sodium (COLACE) 100 MG capsule Take 100 mg by mouth at bedtime.    Yes Historical Provider, MD  feeding supplement, ENSURE ENLIVE, (ENSURE ENLIVE) LIQD Take 237 mLs by mouth 2 (two) times daily between meals. 03/22/16  Yes [name], DO  meloxicam (MOBIC) 7.5 MG tablet Take 7.5 mg by mouth at bedtime as needed for pain.   Yes Historical Provider, MD  Multiple Vitamin (MULTIVITAMIN) capsule Take 1 capsule by mouth daily.   Yes Historical Provider, MD  polyethylene glycol (MIRALAX / GLYCOLAX) packet Take 17 g by mouth daily.  Yes Historical Provider, MD  pravastatin (PRAVACHOL) 40 MG tablet Take 40 mg by mouth daily.   Yes Historical Provider, MD  rivaroxaban (XARELTO) 20 MG TABS tablet Take 20 mg by mouth daily with supper.   Yes Historical Provider, MD  acetaminophen (TYLENOL) 650 MG CR tablet Take 1,300 mg by mouth every 8 (eight) hours as needed for pain.    Historical Provider, MD  cefUROXime (CEFTIN) 250 MG tablet Take 1 tablet (250 mg total) by mouth 2 (two) times daily with a meal. 03/22/16   [name], DO  diltiazem (DILTIAZEM CD) 240 MG 24 hr capsule Take 240 mg by mouth daily.    Historical Provider, MD  meloxicam (MOBIC) 15 MG tablet Take 15 mg by mouth daily.    Historical Provider, MD  mirtazapine (REMERON) 45  MG tablet Take 45 mg by mouth at bedtime.     Historical Provider, MD   Dg Chest 2 View  Result Date: 04/19/2016 CLINICAL DATA:  Weakness, un- witnessed fall at home EXAM: CHEST  2 VIEW COMPARISON:  03/18/2016 FINDINGS: Cardiomediastinal silhouette is stable. Hyperinflation again noted. Status post median sternotomy. No infiltrate or pulmonary edema. Osteopenia and degenerative changes thoracic spine. IMPRESSION: No active disease. Hyperinflation. Status post median sternotomy. Osteopenia and degenerative changes thoracic spine. Electronically Signed   By: Natasha Mead M.D.   On: 04/19/2016 17:05   Ct Hip Right Wo Contrast  Result Date: 04/19/2016 CLINICAL DATA:  Right hip fracture, unwitnessed fall this afternoon. Closed fracture, initial encounter. EXAM: CT OF THE RIGHT HIP WITHOUT CONTRAST TECHNIQUE: Multidetector CT imaging of the right hip was performed according to the standard protocol. Multiplanar CT image reconstructions were also generated. COMPARISON:  Radiographs earlier this day. Reformats from abdominal CT 12/11/2015 FINDINGS: Bones/Joint/Cartilage Right hip total arthroplasty in expected alignment. Periprosthetic fracture involves the proximal femoral stem, lesser and greater trochanters. Fracture is minimally displaced. No significant angulation. No additional fracture of the right hemipelvis. Pubic rami are intact. Right sacroiliac joint and pubic symphysis are congruent. Ligaments Suboptimally assessed by CT. Muscles and Tendons Edematous heterogeneous appearance of the right iliopsoas and gluteal musculature, likely hematoma in the setting of acute injury. Soft tissues Dense vascular calcifications. Diffuse soft tissue edema. Paucity of body wall fat can be seen with cachexia. IMPRESSION: Minimally displaced periprosthetic fracture about right hip prosthesis involving the proximal femoral stem, lesser and greater trochanters. Heterogeneous enlargement of the iliopsoas and gluteal musculature  likely hemorrhage/hematoma in the setting of acute injury. Electronically Signed   By: Rubye Oaks M.D.   On: 04/19/2016 22:16   Dg Knee Complete 4 Views Left  Result Date: 04/19/2016 CLINICAL DATA:  Status post fall at home, weakness EXAM: LEFT KNEE - COMPLETE 4+ VIEW COMPARISON:  None. FINDINGS: Four views of the left knee submitted. No acute fracture or subluxation. Postsurgical changes with 2 wood transverse metallic fixation screws are noted proximal tibia. Diffuse osteopenia is noted. No acute fracture or subluxation. There is diffuse narrowing of joint space. Narrowing of patellofemoral joint space. Tiny joint effusion. IMPRESSION: No acute fracture or subluxation. Diffuse osteopenia. Osteoarthritic changes as described above. Spot tiny joint effusion. Postsurgical changes with metallic fixation material proximal left tibia. Electronically Signed   By: Natasha Mead M.D.   On: 04/19/2016 17:07   Dg Hip Unilat W Or Wo Pelvis 2-3 Views Right  Result Date: 04/19/2016 CLINICAL DATA:  Pain after fall. EXAM: DG HIP (WITH OR WITHOUT PELVIS) 2-3V RIGHT COMPARISON:  December 11, 2015 CT scan  FINDINGS: The patient is status post right hip replacement. The acetabular component is in good position as is the femoral component. There is a step-off/contour abnormality inferior to the right greater trochanter suggesting acute fracture. Subtle step-off in the region of the inferior trochanter. No other fractures are identified. IMPRESSION: The findings are most consistent with an acute fracture seen just inferior to the right greater trochanter and medially at the level of the inferior trochanter. Electronically Signed   By: Gerome Samavid  Williams III M.D   On: 04/19/2016 17:11   Dg Femur Min 2 Views Right  Result Date: 04/19/2016 CLINICAL DATA:  Fall today. Confirmed right hip fx on earlier films. EXAM: RIGHT FEMUR 2 VIEWS COMPARISON:  Right hip same day FINDINGS: Three views of the right femur submitted. Study is  limited by diffuse osteopenia. Right hip prosthesis partially visualized. No distal femoral fracture is noted. Again noted nondisplaced fracture in proximal right femur just inferior to greater femoral trochanter region. IMPRESSION: Study is limited by diffuse osteopenia. Right hip prosthesis partially visualized. No distal femoral fracture is noted. Again noted nondisplaced fracture in proximal right femur just inferior to greater femoral trochanter region. Electronically Signed   By: Natasha MeadLiviu  Pop M.D.   On: 04/19/2016 18:55    Positive ROS: All other systems have been reviewed and were otherwise negative with the exception of those mentioned in the HPI and as above.  Physical Exam: General: Alert, no acute distress Cardiovascular: No pedal edema Respiratory: No cyanosis, no use of accessory musculature GI: No organomegaly, abdomen is soft and non-tender Skin: No lesions in the area of chief complaint Neurologic: Sensation intact distally Lymphatic: No axillary or cervical lymphadenopathy  MUSCULOSKELETAL:  RLE - TTp about GT, along lateral knee and lateral ankle he has some superficial decubiti ulcerations.  Distally he endorses decreased sensation about the foot, but does have intact motor distally, with 1+ DP pulse  Assessment: Right hip Vancuever A peri prosthetic fracture around total hip  Plan: - plan for nonoperative treatment  -ok for 50% WB to RLE with no active abduction and aid of walker for mobility at all times -based on CT his stem is well fixed and fracture should heal without surgery. -PT/OT -pain control - we will plan for outpatient follow up 2 weeks after dc from hospital    Yolonda KidaJason Patrick Ersa Delaney, MD Cell [phone]    04/20/2016 12:19 PM

## 2016-04-20 NOTE — NC FL2 (Signed)
[place] MEDICAID FL2 LEVEL OF CARE SCREENING TOOL     IDENTIFICATION  Patient Name: Ricardo Hendricks Birthdate: 1950-06-27 Sex: male Admission Date (Current Location): 04/19/2016  [facility] and [place] Number:  Producer, television/film/video and Address:  [facility], [address], [place], [place] [address]      Provider Number: [id number]  Attending Physician Name and Address:  Narda Bonds, MD  Relative Name and Phone Number:       Current Level of Care: Hospital Recommended Level of Care: Skilled Nursing Facility Prior Approval Number:    Date Approved/Denied:   PASRR Number:    Discharge Plan: SNF    Current Diagnoses: Patient Active Problem List   Diagnosis Date Noted  . Fall 04/20/2016  . Anemia 04/20/2016  . Periprosthetic hip fracture 04/19/2016  . Essential hypertension 04/19/2016  . HLD (hyperlipidemia) 04/19/2016  . Gout 04/19/2016  . Depression with anxiety 04/19/2016  . Atrial fibrillation, chronic (HCC) 04/19/2016  . CAD (coronary artery disease) 04/19/2016  . Hypotension 04/19/2016  . Leukocytosis 04/19/2016  . Closed hip fracture (HCC) 04/19/2016  . Protein-calorie malnutrition, severe 03/19/2016  . HCAP (healthcare-associated pneumonia)   . AKI (acute kidney injury) (HCC)   . Sepsis (HCC) 03/17/2016  . Pressure injury of skin 03/17/2016    Orientation RESPIRATION BLADDER Height & Weight     Self, Place, Situation  Normal Continent Weight: 115 lb (52.2 kg) Height:  5\' 8"  (172.7 cm)  BEHAVIORAL SYMPTOMS/MOOD NEUROLOGICAL BOWEL NUTRITION STATUS      Continent  (Please see d/c summary)  AMBULATORY STATUS COMMUNICATION OF NEEDS Skin     Verbally PU Stage and Appropriate Care, Skin abrasions (Non pressure wound on right elbow. Non pressure wound on left knee.)   PU Stage 2 Dressing:  (Right Ankle)                   Personal Care Assistance Level of Assistance              Functional Limitations Info              SPECIAL CARE FACTORS FREQUENCY  PT (By licensed PT), OT (By licensed OT)     PT Frequency: 2x OT Frequency: 2x            Contractures Contractures Info: Not present    Additional Factors Info  Code Status, Allergies Code Status Info: Full Code Allergies Info: No known allergies           Current Medications (04/20/2016):  This is the current hospital active medication list Current Facility-Administered Medications  Medication Dose Route Frequency Provider Last Rate Last Dose  . 0.9 %  sodium chloride infusion   Intravenous Continuous Lorretta Harp, MD 75 mL/hr at 04/20/16 0945 75 mL/hr at 04/20/16 0945  . acetaminophen (TYLENOL) tablet 650 mg  650 mg Oral Q8H PRN Lorretta Harp, MD      . allopurinol (ZYLOPRIM) tablet 300 mg  300 mg Oral Daily Lorretta Harp, MD   300 mg at 04/20/16 0945  . bisacodyl (DULCOLAX) suppository 10 mg  10 mg Rectal Daily PRN Lorretta Harp, MD      . clonazePAM Scarlette Calico) tablet 1 mg  1 mg Oral TID Lorretta Harp, MD   1 mg at 04/20/16 0945  . diltiazem (CARDIZEM CD) 24 hr capsule 180 mg  180 mg Oral Daily Lorretta Harp, MD   180 mg at 04/20/16 0945  . docusate sodium (COLACE) capsule 100  mg  100 mg Oral QHS Lorretta HarpXilin Niu, MD   100 mg at 04/20/16 0027  . meloxicam (MOBIC) tablet 7.5 mg  7.5 mg Oral QHS PRN Lorretta HarpXilin Niu, MD      . methocarbamol (ROBAXIN) tablet 500 mg  500 mg Oral Q8H PRN Lorretta HarpXilin Niu, MD      . morphine 2 MG/ML injection 2 mg  2 mg Intravenous Q4H PRN Lorretta HarpXilin Niu, MD      . multivitamin with minerals tablet 1 tablet  1 tablet Oral Daily Lorretta HarpXilin Niu, MD   1 tablet at 04/20/16 0945  . ondansetron (ZOFRAN) injection 4 mg  4 mg Intravenous Q8H PRN Lorretta HarpXilin Niu, MD      . oxyCODONE-acetaminophen (PERCOCET/ROXICET) 5-325 MG per tablet 1 tablet  1 tablet Oral Q4H PRN Lorretta HarpXilin Niu, MD      . polyethylene glycol (MIRALAX / GLYCOLAX) packet 17 g  17 g Oral Daily PRN Lorretta HarpXilin Niu, MD      . pravastatin (PRAVACHOL) tablet 40 mg  40 mg Oral q1800 Lorretta HarpXilin Niu, MD      . zolpidem [facility](AMBIEN)  tablet 5 mg  5 mg Oral QHS PRN Lorretta HarpXilin Niu, MD   5 mg at 04/20/16 0027     Discharge Medications: Please see discharge summary for a list of discharge medications.  Relevant Imaging Results:  Relevant Lab Results:   Additional Information SSN: [id number]  Maree KrabbeBridget A Chaos Carlile, LCSW

## 2016-04-20 NOTE — Clinical Social Work Note (Signed)
CSW has initiated bed search. When CSW was speaking with pt's sister Palliative was coming to evaluate pt. CSW awaiting to determine Palliatives recommendation.  [address] Hill Field CourtBridget Mayton, ConnecticutLCSWA [phone]

## 2016-04-20 NOTE — Consult Note (Signed)
Consultation Note Date: 04/20/2016   Patient Name: Ricardo Hendricks  DOB: Jan 24, 1951  MRN: [id number]  Age / Sex: 66 y.o., male  PCP: Tor NettersSara M. Kristen LoaderFurr, MD Referring Physician: Narda Bondsalph A Nettey, MD  Reason for Consultation: Establishing goals of care and Psychosocial/spiritual support  HPI/Patient Profile: 66 y.o. male   admitted on 04/19/2016 with past medical history significant of hypertension, hyperlipidemia, gout, depression with anxiety, atrial fibrillation on Xarelto, hernia of testicle, CAD, CABG, who presents with right hip pain after fall.  On further exploration, history obtained from sister Danelle Earthlyoel, Mr Earl ManyBarnwell has a long psych history specific to a Bi Polar diagnosis.  He has had no recent psychiatric follow-up, he has had an inpatient hospitalization and per family most of the medications were stopped after discharge and never restarted  CT-right hip showed minimally displaced periprosthetic fracture about right hip prosthesis involving the proximal femoral stem, lesser and greater trochanters. Heterogeneous enlargement of the iliopsoas and gluteal musculature likely hemorrhage/hematoma in the setting of acute injury.   Per orthopedics plan is for non-operative treatment  ED Course: pt was found to have WBC 23.8, hemoglobin 12.8, positive urinalysis with small amount of leukocyte,   Per family patient has had continued physical, functional and cognitive decline over the past many months.  They face advanced directive decisions and anticipatory care needs.   Clinical Assessment and Goals of Care:  This NP Lorinda CreedMary Larach reviewed medical records, received report from team, assessed the patient and then meet at the patient's bedside along with his sister and brother in law  to discuss diagnosis, prognosis, GOC, EOL wishes disposition and options.  A detailed discussion was had today regarding advanced  directives.  Concepts specific to code status, artifical feeding and hydration, continued IV antibiotics and rehospitalization was had.  The difference between a aggressive medical intervention path  and a palliative comfort care path for this patient at this time was had.  Values and goals of care important to patient and family were attempted to be elicited.  MOST form introduced  Concept of Hospice and Palliative Care were discussed  Natural trajectory and expectations at EOL were discussed.  Questions and concerns addressed.   Family encouraged to call with questions or concerns.  PMT will continue to support holistically.   PATIENT continues to weigh in on health decsions but depends on his sister POA/HPOA to be the main decision maker.   She will bring in documentation for scanning in the morning.   SUMMARY OF RECOMMENDATIONS    Code Status/Advance Care Planning:   DNR-documented today   Symptom Management:  Strongly recommend OP psychiatric evaluation and treatment   Palliative Prophylaxis:   Aspiration, Bowel Regimen, Delirium Protocol, Frequent Pain Assessment and Oral Care  Additional Recommendations (Limitations, Scope, Preferences):  Full Scope Treatment  Psycho-social/Spiritual:   Desire for further Chaplaincy support:no  Additional Recommendations: Education on Hospice  Prognosis:   Unable to determine  Will depends on desire for life prolonging intervetnions--high risk for decompensation  Discharge Planning: Skilled Nursing Facility  for rehab with Palliative care service follow-up      Primary Diagnoses: Present on Admission: . Essential hypertension . HLD (hyperlipidemia) . Gout . Depression with anxiety . Atrial fibrillation, chronic (HCC) . CAD (coronary artery disease) . Hypotension . Leukocytosis . Closed hip fracture (HCC) . Fall . Anemia . Stage II pressure ulcer of right ankle   I have reviewed the medical record, interviewed the  patient and family, and examined the patient. The following aspects are pertinent.  Past Medical History:  Diagnosis Date  . A-fib (HCC)   . Bipolar 1 disorder (HCC)   . Gout   . Hernia of testicle   . Hyperlipemia   . Hypertension    Social History   Social History  . Marital status: Unknown    Spouse name: N/A  . Number of children: N/A  . Years of education: N/A   Social History Main Topics  . Smoking status: Never Smoker  . Smokeless tobacco: Never Used  . Alcohol use No  . Drug use: No  . Sexual activity: Not Asked   Other Topics Concern  . None   Social History Narrative  . None   Family History  Problem Relation Age of Onset  . Heart attack Mother   . Heart attack Father   . Heart attack Brother    Scheduled Meds: . allopurinol  300 mg Oral Daily  . clonazePAM  1 mg Oral TID  . diltiazem  180 mg Oral Daily  . docusate sodium  100 mg Oral QHS  . multivitamin with minerals  1 tablet Oral Daily  . pravastatin  40 mg Oral q1800   Continuous Infusions: . sodium chloride 75 mL/hr (04/20/16 0945)   PRN Meds:.acetaminophen, bisacodyl, meloxicam, methocarbamol, morphine injection, ondansetron, oxyCODONE-acetaminophen, polyethylene glycol, zolpidem Medications Prior to Admission:  Prior to Admission medications   Medication Sig Start Date End Date Taking? Authorizing Provider  acetaminophen (TYLENOL) 500 MG tablet Take 500 mg by mouth at bedtime.   Yes Historical Provider, MD  allopurinol (ZYLOPRIM) 300 MG tablet Take 300 mg by mouth daily.   Yes Historical Provider, MD  aspirin EC 81 MG tablet Take 81 mg by mouth daily.   Yes Historical Provider, MD  bisacodyl (DULCOLAX) 10 MG suppository Place 1 suppository (10 mg total) rectally daily as needed for moderate constipation. 03/23/16  Yes [name], DO  clonazePAM (KLONOPIN) 1 MG tablet Take 1 tablet (1 mg total) by mouth 3 (three) times daily. 03/23/16  Yes [name], DO  diltiazem (DILACOR XR) 180 MG  24 hr capsule Take 180 mg by mouth daily.   Yes Historical Provider, MD  docusate sodium (COLACE) 100 MG capsule Take 100 mg by mouth at bedtime.    Yes Historical Provider, MD  feeding supplement, ENSURE ENLIVE, (ENSURE ENLIVE) LIQD Take 237 mLs by mouth 2 (two) times daily between meals. 03/22/16  Yes [name], DO  meloxicam (MOBIC) 7.5 MG tablet Take 7.5 mg by mouth at bedtime as needed for pain.   Yes Historical Provider, MD  Multiple Vitamin (MULTIVITAMIN) capsule Take 1 capsule by mouth daily.   Yes Historical Provider, MD  polyethylene glycol (MIRALAX / GLYCOLAX) packet Take 17 g by mouth daily.   Yes Historical Provider, MD  pravastatin (PRAVACHOL) 40 MG tablet Take 40 mg by mouth daily.   Yes Historical Provider, MD  rivaroxaban (XARELTO) 20 MG TABS tablet Take 20 mg by mouth daily with supper.   Yes Historical Provider, MD  acetaminophen (  TYLENOL) 650 MG CR tablet Take 1,300 mg by mouth every 8 (eight) hours as needed for pain.    Historical Provider, MD  cefUROXime (CEFTIN) 250 MG tablet Take 1 tablet (250 mg total) by mouth 2 (two) times daily with a meal. 03/22/16   [name], DO  clonazePAM (KLONOPIN) 1 MG tablet Take 1 tablet (1 mg total) by mouth 3 (three) times daily. 04/20/16   Narda Bonds, MD  diltiazem (DILTIAZEM CD) 240 MG 24 hr capsule Take 240 mg by mouth daily.    Historical Provider, MD  meloxicam (MOBIC) 15 MG tablet Take 15 mg by mouth daily.    Historical Provider, MD  mirtazapine (REMERON) 45 MG tablet Take 45 mg by mouth at bedtime.     Historical Provider, MD   No Known Allergies Review of Systems  Musculoskeletal:       - right hip pain  Neurological: Positive for weakness.    Physical Exam  Constitutional: He appears cachectic. He appears ill.  Cardiovascular: Normal rate, regular rhythm and normal heart sounds.   Pulmonary/Chest: He has decreased breath sounds in the right lower field and the left lower field.  Neurological: He is alert.    Skin: Skin is warm and dry.  Psychiatric: His speech is delayed. He is slowed. He exhibits a depressed mood.    Vital Signs: BP (!) 89/64 (BP Location: Left Arm)   Pulse 84   Temp 97.9 F (36.6 C) (Oral)   Resp 16   Ht 5\' 8"  (1.727 m)   Wt 52.2 kg (115 lb)   SpO2 100%   BMI 17.49 kg/m  Pain Assessment: 0-10   Pain Score: 0-No pain   SpO2: SpO2: 100 % O2 Device:SpO2: 100 % O2 Flow Rate: .   IO: Intake/output summary:  Intake/Output Summary (Last 24 hours) at 04/20/16 1428 Last data filed at 04/19/16 2136  Gross per 24 hour  Intake             2500 ml  Output              235 ml  Net             2265 ml    LBM: Last BM Date: 04/18/16 Baseline Weight: Weight: 52.2 kg (115 lb) Most recent weight: Weight: 52.2 kg (115 lb)      Palliative Assessment/Data: 30 % at bert    Flowsheet Rows     Most Recent Value  Intake Tab  Referral Department  Hospitalist  Unit at Time of Referral  Orthopedic Unit  Palliative Care Primary Diagnosis  Cardiac  Date Notified  04/20/16  Palliative Care Type  New Palliative care  Reason for referral  Clarify Goals of Care  Date of Admission  04/19/16  # of days IP prior to Palliative referral  1  Clinical Assessment  Psychosocial & Spiritual Assessment  Palliative Care Outcomes      Time In: 1315 Time Out: 1430 Time Total: 75 min Greater than 50%  of this time was spent counseling and coordinating care related to the above assessment and plan.  Signed by: Lorinda Creed, NP   Please contact Palliative Medicine Team phone at [phone] for questions and concerns.  For individual provider: See Loretha Stapler

## 2016-04-20 NOTE — Evaluation (Signed)
Occupational Therapy Evaluation Patient Details Name: Ricardo Hendricks MRN: [id number] DOB: 1950/11/06 Today's Date: 04/20/2016    History of Present Illness Pt is a 66 yo male admitted after a fall and has sustained a R hip/femur fx.  Hardware is stable and orthopedics has opted for nonoperative management at this time with 50% WB and no active abduction of R hip.  Pt with history of CAD, CABG, HTN, gout, bipolar, B shoulder surgeries, afib, THA.  Pt has been in rehab recently for sepsis due to PNU.    Clinical Impression   Pt admitted with the above diagnosis and has the deficits listed below. Pt would benefit from cont OT to increase independence with basic adls so he can return to [facility] assisted [facility] facility. Pt has been there for some time now but was recently in rehab for pneumonia and weakness.  Unsure how much assist this facility can provide so pt may need SNF rehab.  Pt with some significant cognitive deficits that make it difficult for him to recall his no abduction precaution of R hip and partial WB.  Pt perseverating on asking if he can go home today and asking over and over if they will do surgery.  Pt chart states they will not.  Will follow acutely.    Follow Up Recommendations  SNF;Supervision/Assistance - 24 hour    Equipment Recommendations  None recommended by OT    Recommendations for Other Services       Precautions / Restrictions Precautions Precautions: Fall;Other (comment) (50% WB and no active abduction on R LE) Precaution Comments: Pt has difficulty recalling and following precautions. Restrictions Weight Bearing Restrictions: Yes RLE Weight Bearing: Partial weight bearing RLE Partial Weight Bearing Percentage or Pounds: 50%      Mobility Bed Mobility Overal bed mobility: Needs Assistance Bed Mobility: Supine to Sit     Supine to sit: Mod assist;HOB elevated     General bed mobility comments: mod assist needed to ensure pt did not actively  abduct his R hip.    Transfers Overall transfer level: Needs assistance Equipment used: Rolling walker (2 wheeled) Transfers: Sit to/from [employer] CorporationStand;Stand Pivot Transfers Sit to Stand: Min assist;From elevated surface Stand pivot transfers: Mod assist;From elevated surface       General transfer comment: cuing and physical assist required to maintain PWB in RLE and no active abduction.  Pt with great difficulty recalling and remembering these precautions.    Balance Overall balance assessment: History of Falls;Needs assistance Sitting-balance support: Feet supported Sitting balance-Leahy Scale: Fair     Standing balance support: Bilateral upper extremity supported;During functional activity Standing balance-Leahy Scale: Poor Standing balance comment: Pt must have outside support to stand and has difficulty with precautions.                           ADL either performed or assessed with clinical judgement   ADL Overall ADL's : Needs assistance/impaired Eating/Feeding: NPO   Grooming: Set up;Sitting   Upper Body Bathing: Sitting;Minimal assistance   Lower Body Bathing: Sit to/from stand;Total assistance;Cueing for compensatory techniques;Cueing for safety;With adaptive equipment   Upper Body Dressing : Minimal assistance;Sitting Upper Body Dressing Details (indicate cue type and reason): Pt states he wears loose t shirts and can get them over his head while sitting at home. Lower Body Dressing: Total assistance;Sit to/from stand;Cueing for compensatory techniques   Toilet Transfer: Moderate assistance;Cueing for safety;BSC;RW;Cueing for sequencing (cuing for WB precautions)   Toileting-  Clothing Manipulation and Hygiene: Moderate assistance;Sit to/from stand;Cueing for safety;Cueing for sequencing       Functional mobility during ADLs: Moderate assistance;Rolling walker;Cueing for safety General ADL Comments: Pt has a lot of assist with bathing at home.  Pt needs  assist for LE adls because he forgets he has weight bearing precautions and is not allowed to actively abduct his leg.     Vision Baseline Vision/History: Cataracts (had surgery) Patient Visual Report: No change from baseline Vision Assessment?: No apparent visual deficits     Perception Perception Perception Tested?: No   Praxis      Pertinent Vitals/Pain Pain Assessment: Faces Faces Pain Scale: Hurts a little bit Pain Location: R hip Pain Descriptors / Indicators: Aching;Grimacing Pain Intervention(s): Limited activity within patient's tolerance;Monitored during session;Repositioned     Hand Dominance Right   Extremity/Trunk Assessment Upper Extremity Assessment Upper Extremity Assessment: RUE deficits/detail;LUE deficits/detail RUE Deficits / Details: AROM in shoulder flex and abdution limted to 45 degrees. RUE: Unable to fully assess due to pain RUE Coordination: decreased gross motor LUE Deficits / Details: AROM in shoulder limited to 45 Degrees.   LUE: Unable to fully assess due to pain LUE Coordination: decreased gross motor   Lower Extremity Assessment Lower Extremity Assessment: Defer to PT evaluation   Cervical / Trunk Assessment Cervical / Trunk Assessment: Kyphotic   Communication Communication Communication: No difficulties   Cognition Arousal/Alertness: Awake/alert Behavior During Therapy: Anxious Overall Cognitive Status: No family/caregiver present to determine baseline cognitive functioning                                 General Comments: Feel pt is most likely at baseline with cognition based on chair but no family members here to confirm.   General Comments  Pt with skin tears to R elbow and knee.      Exercises     Shoulder Instructions      Home Living Family/patient expects to be discharged to:: Assisted living                             Home Equipment: Grab bars - toilet;Other (comment) (pt is unsure)    Additional Comments: Pt unsure of what equipment he has at home.      Prior Functioning/Environment Level of Independence: Needs assistance  Gait / Transfers Assistance Needed: Patient reports he is independent with ambulation and no assistive device. ADL's / Homemaking Assistance Needed: Meals and housekeeping provided by facility.  Pt states his sister bathes him but he can dress himself, toilet, groom and feed self.  Unsure of accuracy as pt is a poor historian.   Comments: Unsure if information from patient is reliable.        OT Problem List: Decreased strength;Decreased range of motion;Impaired balance (sitting and/or standing);Decreased cognition;Decreased safety awareness;Decreased knowledge of use of DME or AE;Decreased knowledge of precautions;Impaired UE functional use;Pain      OT Treatment/Interventions: Self-care/ADL training;Therapeutic activities;DME and/or AE instruction;Balance training    OT Goals(Current goals can be found in the care plan section) Acute Rehab OT Goals Patient Stated Goal: to go home asap OT Goal Formulation: With patient Time For Goal Achievement: 05/04/16 Potential to Achieve Goals: Fair ADL Goals Pt Will Perform Grooming: with min guard assist;standing Pt Will Perform Lower Body Dressing: with min assist;sit to/from stand;with adaptive equipment Pt Will Transfer to Toilet: with min  guard assist;ambulating;bedside commode;regular height toilet Pt Will Perform Toileting - Clothing Manipulation and hygiene: with min assist;sit to/from stand  OT Frequency: Min 2X/week   Barriers to D/C: Decreased caregiver support  unsure how much assist the AL facility [facility] can provide.  Pt will need max care due to cognitive deficits. Pt cannot recall and therefore follow his new precautions and WB status.       Co-evaluation              End of Session Equipment Utilized During Treatment: Engineer, water Communication:  Mobility status;Weight bearing status;Precautions  Activity Tolerance: Patient tolerated treatment well Patient left: in chair;with call bell/phone within reach;with chair alarm set  OT Visit Diagnosis: Unsteadiness on feet (R26.81);History of falling (Z91.81)                Time: 1610-9604 OT Time Calculation (min): 27 min Charges:  OT General Charges $OT Visit: 1 Procedure OT Evaluation $OT Eval Moderate Complexity: 1 Procedure OT Treatments $Self Care/Home Management : 8-22 mins G-Codes:     Tory Emerald, OTR/L [phone]  Hope Budds 04/20/2016, 9:48 AM

## 2016-04-20 NOTE — Discharge Summary (Addendum)
Physician Discharge Summary  Ricardo MayWilliam Silberman ZOX:[id number] DOB: 23-Dec-1950 DOA: 04/19/2016  PCP: Ninetta LightsFURR,SARA, MD  Admit date: 04/19/2016 Discharge date: 04/24/2016 Admitted From: ALF Disposition: SNF  Recommendations for Outpatient Follow-up:  1. Follow up with PCP in 1 week 2. Follow up with Dr. Aundria Rudogers in 2 weeks 3. Please obtain CBC in 3-5 days to recheck hemoglobin 4. Mobility recommendations: 50% weight bearing to RLE with no active abduction and aid of walker for mobility at all times 5. Palliative medicine/hospice referral as an outpatient 6. Pending results: blood culture; urine culture  Discharge Condition: Stable CODE STATUS: DNR/DNI Diet recommendation: Heart healthy   Brief/Interim Summary:  Admission HPI written by Lorretta HarpXilin Niu, MD   Chief Complaint: right hip pain after fall  HPI: Ricardo Hendricks is a 66 y.o. male with medical history significant of hypertension, hyperlipidemia, gout, depression with anxiety, atrial fibrillation on Xarelto, hernia of testicle, CAD, CABG, who presents with right hip pain after fall.  Patient states that she slipped and fell when he bent over to pick up something, injured his right hip. He also has small skin tear in the left kidney and right elbow. He did not lose consciousness. He strongly denies any head and neck injury. No neck or head pain. Patient states that his right hip pain is constant, 8 out of 10 in severity, nonradiating. It is aggravated by movement. He does not have leg numbness. Per report, pt was on the floor for about 2 hours before he was helped up. Patient states that he has mild cough, no chest pain or SOB. He was recently treated for pneumonia and discharged to rehabilitation facility, just left rehabilitation last week. Currently he is living in assisted living facility. Patient does not have a nausea, vomiting, diarrhea, abdominal pain or symptoms of UTI. No unilateral weakness. Patient had hypotension with blood pressure  74/56 which improved to 107/81 after 1.5 L normal saline bolus.  ED Course: pt was found to have WBC 23.8, hemoglobin 12.8, positive urinalysis with small amount of leukocyte, electrolytes renal function okay, temperature normal, O2 sats saturation 100% on room air, negative chest x-ray, negative x-ray of left knee joint. X-ray of Femur, R-hip/pelvis showed right hip fracture which is further confirmed by CT-scan. Pt is admitted to tele bed as inpt. Ortho, Dr. Aundria Rudogers was consulted by EDP.    Hospital course:  Periprosthetic right hip fracture Sustained after slipping and falling. Seen on CT scan with minimal displacement. Orthopedic surgery consulted and recommended non-operative management. Physical therapy recommended skilled nursing facility placement for rehab.  Leukocytosis Stress reaction. Improved.  Hypotension Present on initial presentation.  Anemia Likely secondary to hematoma vs hemorrhage. Hemoglobin stabilized. Xarelto held. Discontinue on discharge. Will need close follow-up of hemoglobin in 1-2 days. If still trending down  Atrial fibrillation CHA2DS2-VASc Score is 4. Currently in atrial fibrillation with controlled rate. Discussed risks and benefits of anticoagulation. Decision made to hold Xarelto at least for the next 3-5 days to allow hematoma to fully stabilize. Consider restarting and watching hemoglobin carefully.  Essential hypertension Continued Cardizem  Gout Continued allopurinol  Hyperlipidemia Continued pravastatin.  Depression Anxiety Continued Klonopin and Remeron  CAD s/p CABG. No chest pain. Continued statin and held aspirin. Restart aspirin.   Discharge Diagnoses:  Principal Problem:   Periprosthetic hip fracture Active Problems:   Essential hypertension   HLD (hyperlipidemia)   Gout   Depression with anxiety   Atrial fibrillation, chronic (HCC)   CAD (coronary artery disease)   Hypotension  Leukocytosis   Closed hip fracture  (HCC)   Fall   Anemia   Stage II pressure ulcer of right ankle    Discharge Instructions   Allergies as of 04/20/2016   No Known Allergies     Medication List    STOP taking these medications   cefUROXime 250 MG tablet Commonly known as:  CEFTIN   DILTIAZEM CD 240 MG 24 hr capsule Generic drug:  diltiazem     TAKE these medications   acetaminophen 650 MG CR tablet Commonly known as:  TYLENOL Take 1,300 mg by mouth every 8 (eight) hours as needed for pain.   acetaminophen 500 MG tablet Commonly known as:  TYLENOL Take 500 mg by mouth at bedtime.   allopurinol 300 MG tablet Commonly known as:  ZYLOPRIM Take 300 mg by mouth daily.   aspirin EC 81 MG tablet Take 81 mg by mouth daily.   bisacodyl 10 MG suppository Commonly known as:  DULCOLAX Place 1 suppository (10 mg total) rectally daily as needed for moderate constipation.   clonazePAM 1 MG tablet Commonly known as:  KLONOPIN Take 1 tablet (1 mg total) by mouth 3 (three) times daily.   diltiazem 180 MG 24 hr capsule Commonly known as:  DILACOR XR Take 180 mg by mouth daily.   docusate sodium 100 MG capsule Commonly known as:  COLACE Take 100 mg by mouth at bedtime.   feeding supplement (ENSURE ENLIVE) Liqd Take 237 mLs by mouth 2 (two) times daily between meals.   meloxicam 7.5 MG tablet Commonly known as:  MOBIC Take 7.5 mg by mouth at bedtime as needed for pain. What changed:  Another medication with the same name was removed. Continue taking this medication, and follow the directions you see here.   mirtazapine 45 MG tablet Commonly known as:  REMERON Take 45 mg by mouth at bedtime.   multivitamin capsule Take 1 capsule by mouth daily.   polyethylene glycol packet Commonly known as:  MIRALAX / GLYCOLAX Take 17 g by mouth daily.   pravastatin 40 MG tablet Commonly known as:  PRAVACHOL Take 40 mg by mouth daily.   rivaroxaban 20 MG Tabs tablet Commonly known as:  XARELTO Take 20 mg by  mouth daily with supper.      Follow-up Information    FURR,SARA, MD. Schedule an appointment as soon as possible for a visit.   Specialty:  Internal Medicine Contact information: [address] [place] [address] [phone]        Yolonda Kida, MD. Schedule an appointment as soon as possible for a visit in 2 week(s).   Specialty:  Orthopedic Surgery Contact information: [address] [place] [address] [place] [address] [phone]          No Known Allergies  Consultations:  Orthopedic surgery   Procedures/Studies: Dg Chest 2 View  Result Date: 04/19/2016 CLINICAL DATA:  Weakness, un- witnessed fall at home EXAM: CHEST  2 VIEW COMPARISON:  03/18/2016 FINDINGS: Cardiomediastinal silhouette is stable. Hyperinflation again noted. Status post median sternotomy. No infiltrate or pulmonary edema. Osteopenia and degenerative changes thoracic spine. IMPRESSION: No active disease. Hyperinflation. Status post median sternotomy. Osteopenia and degenerative changes thoracic spine. Electronically Signed   By: Natasha Mead M.D.   On: 04/19/2016 17:05   Ct Head Wo Contrast  Result Date: 03/21/2016 CLINICAL DATA:  Status post fall with a blow to the head today. EXAM: CT HEAD WITHOUT CONTRAST TECHNIQUE: Contiguous axial images were obtained  from the base of the skull through the vertex without intravenous contrast. COMPARISON:  Head and cervical spine CT scans 12/31/2015. FINDINGS: Brain: Cortical atrophy and chronic microvascular ischemic change are again seen. No evidence of acute abnormality including hemorrhage, infarct, mass lesion, mass effect, midline shift or abnormal extra-axial fluid collection is identified. No hydrocephalus or pneumocephalus. Vascular: Atherosclerotic vascular disease is noted. Skull: Intact. Sinuses/Orbits: Short air-fluid level in the right maxillary sinus is compatible with acute sinusitis. Paranasal sinuses are otherwise clear. The  patient is status post bilateral lens extraction. Other: None. IMPRESSION: No acute intracranial abnormality. Findings compatible with acute right maxillary sinusitis. Atherosclerosis. Atrophy and chronic microvascular ischemic change. Electronically Signed   By: Drusilla Kanner M.D.   On: 03/21/2016 14:47   Ct Hip Right Wo Contrast  Result Date: 04/19/2016 CLINICAL DATA:  Right hip fracture, unwitnessed fall this afternoon. Closed fracture, initial encounter. EXAM: CT OF THE RIGHT HIP WITHOUT CONTRAST TECHNIQUE: Multidetector CT imaging of the right hip was performed according to the standard protocol. Multiplanar CT image reconstructions were also generated. COMPARISON:  Radiographs earlier this day. Reformats from abdominal CT 12/11/2015 FINDINGS: Bones/Joint/Cartilage Right hip total arthroplasty in expected alignment. Periprosthetic fracture involves the proximal femoral stem, lesser and greater trochanters. Fracture is minimally displaced. No significant angulation. No additional fracture of the right hemipelvis. Pubic rami are intact. Right sacroiliac joint and pubic symphysis are congruent. Ligaments Suboptimally assessed by CT. Muscles and Tendons Edematous heterogeneous appearance of the right iliopsoas and gluteal musculature, likely hematoma in the setting of acute injury. Soft tissues Dense vascular calcifications. Diffuse soft tissue edema. Paucity of body wall fat can be seen with cachexia. IMPRESSION: Minimally displaced periprosthetic fracture about right hip prosthesis involving the proximal femoral stem, lesser and greater trochanters. Heterogeneous enlargement of the iliopsoas and gluteal musculature likely hemorrhage/hematoma in the setting of acute injury. Electronically Signed   By: Rubye Oaks M.D.   On: 04/19/2016 22:16   Dg Knee Complete 4 Views Left  Result Date: 04/19/2016 CLINICAL DATA:  Status post fall at home, weakness EXAM: LEFT KNEE - COMPLETE 4+ VIEW COMPARISON:   None. FINDINGS: Four views of the left knee submitted. No acute fracture or subluxation. Postsurgical changes with 2 wood transverse metallic fixation screws are noted proximal tibia. Diffuse osteopenia is noted. No acute fracture or subluxation. There is diffuse narrowing of joint space. Narrowing of patellofemoral joint space. Tiny joint effusion. IMPRESSION: No acute fracture or subluxation. Diffuse osteopenia. Osteoarthritic changes as described above. Spot tiny joint effusion. Postsurgical changes with metallic fixation material proximal left tibia. Electronically Signed   By: Natasha Mead M.D.   On: 04/19/2016 17:07   Dg Hip Unilat W Or Wo Pelvis 2-3 Views Right  Result Date: 04/19/2016 CLINICAL DATA:  Pain after fall. EXAM: DG HIP (WITH OR WITHOUT PELVIS) 2-3V RIGHT COMPARISON:  December 11, 2015 CT scan FINDINGS: The patient is status post right hip replacement. The acetabular component is in good position as is the femoral component. There is a step-off/contour abnormality inferior to the right greater trochanter suggesting acute fracture. Subtle step-off in the region of the inferior trochanter. No other fractures are identified. IMPRESSION: The findings are most consistent with an acute fracture seen just inferior to the right greater trochanter and medially at the level of the inferior trochanter. Electronically Signed   By: Gerome Sam III M.D   On: 04/19/2016 17:11   Dg Femur Min 2 Views Right  Result Date: 04/19/2016 CLINICAL DATA:  Fall today. Confirmed right hip fx on earlier films. EXAM: RIGHT FEMUR 2 VIEWS COMPARISON:  Right hip same day FINDINGS: Three views of the right femur submitted. Study is limited by diffuse osteopenia. Right hip prosthesis partially visualized. No distal femoral fracture is noted. Again noted nondisplaced fracture in proximal right femur just inferior to greater femoral trochanter region. IMPRESSION: Study is limited by diffuse osteopenia. Right hip prosthesis  partially visualized. No distal femoral fracture is noted. Again noted nondisplaced fracture in proximal right femur just inferior to greater femoral trochanter region. Electronically Signed   By: Natasha Mead M.D.   On: 04/19/2016 18:55      Subjective: Patient reports minimal pain. Mostly when he is on his leg.   Discharge Exam: Vitals:   04/19/16 2243 04/20/16 0634  BP: 107/81 95/77  Pulse: (!) 114 91  Resp: 18 16  Temp: 98.4 F (36.9 C) 98.4 F (36.9 C)   Vitals:   04/19/16 2145 04/19/16 2149 04/19/16 2243 04/20/16 0634  BP: 96/71 100/70 107/81 95/77  Pulse: 78 (!) 57 (!) 114 91  Resp:   18 16  Temp:   98.4 F (36.9 C) 98.4 F (36.9 C)  TempSrc:   Oral Oral  SpO2: 98% 98% 100% 100%  Weight:      Height:        General: Pt is alert, awake, not in acute distress Cardiovascular: irregular rhythm, normal rate, S1/S2 +, no rubs, no gallops Respiratory: CTA bilaterally, no wheezing, no rhonchi Abdominal: Soft, NT, ND, bowel sounds + Extremities: no edema, no cyanosis    The results of significant diagnostics from this hospitalization (including imaging, microbiology, ancillary and laboratory) are listed below for reference.     Microbiology: Recent Results (from the past 240 hour(s))  Surgical pcr screen     Status: None   Collection Time: 04/20/16  2:00 AM  Result Value Ref Range Status   MRSA, PCR NEGATIVE NEGATIVE Final   Staphylococcus aureus NEGATIVE NEGATIVE Final    Comment:        The Xpert SA Assay (FDA approved for NASAL specimens in patients over 77 years of age), is one component of a comprehensive surveillance program.  Test performance has been validated by [facility] for patients greater than or equal to 62 year old. It is not intended to diagnose infection nor to guide or monitor treatment.      Labs: BNP (last 3 results)  Recent Labs  03/19/16 1518  BNP 643.0*   Basic Metabolic Panel:  Recent Labs Lab 04/19/16 1600  NA 141   K 4.6  CL 102  CO2 25  GLUCOSE 142*  BUN 20  CREATININE 1.07  CALCIUM 9.3   Liver Function Tests:  Recent Labs Lab 04/19/16 1600  AST 54*  ALT 17  ALKPHOS 120  BILITOT 0.6  PROT 6.6  ALBUMIN 3.3*   No results for input(s): LIPASE, AMYLASE in the last 168 hours. No results for input(s): AMMONIA in the last 168 hours. CBC:  Recent Labs Lab 04/19/16 1600 04/20/16 0028 04/20/16 0305 04/20/16 0612 04/20/16 1146  WBC 23.8* 13.8* 14.3* 12.7* 13.4*  NEUTROABS 22.4*  --   --   --   --   HGB 12.8* 9.5* 9.1* 8.6* 9.0*  HCT 38.9* 29.1* 28.4* 26.7* 27.7*  MCV 93.5 94.5 94.0 94.0 94.2  PLT 262 183 197 192 195   Cardiac Enzymes:  Recent Labs Lab 04/19/16 1600  CKTOTAL 150   BNP: Invalid input(s): POCBNP  CBG: No results for input(s): GLUCAP in the last 168 hours. D-Dimer No results for input(s): DDIMER in the last 72 hours. Hgb A1c No results for input(s): HGBA1C in the last 72 hours. Lipid Profile No results for input(s): CHOL, HDL, LDLCALC, TRIG, CHOLHDL, LDLDIRECT in the last 72 hours. Thyroid function studies No results for input(s): TSH, T4TOTAL, T3FREE, THYROIDAB in the last 72 hours.  Invalid input(s): FREET3 Anemia work up No results for input(s): VITAMINB12, FOLATE, FERRITIN, TIBC, IRON, RETICCTPCT in the last 72 hours. Urinalysis    Component Value Date/Time   COLORURINE AMBER (A) 04/19/2016 1755   APPEARANCEUR CLOUDY (A) 04/19/2016 1755   LABSPEC 1.018 04/19/2016 1755   PHURINE 7.0 04/19/2016 1755   GLUCOSEU NEGATIVE 04/19/2016 1755   HGBUR LARGE (A) 04/19/2016 1755   BILIRUBINUR NEGATIVE 04/19/2016 1755   KETONESUR NEGATIVE 04/19/2016 1755   PROTEINUR NEGATIVE 04/19/2016 1755   NITRITE NEGATIVE 04/19/2016 1755   LEUKOCYTESUR SMALL (A) 04/19/2016 1755   Sepsis Labs Invalid input(s): PROCALCITONIN,  WBC,  LACTICIDVEN Microbiology Recent Results (from the past 240 hour(s))  Surgical pcr screen     Status: None   Collection Time: 04/20/16   2:00 AM  Result Value Ref Range Status   MRSA, PCR NEGATIVE NEGATIVE Final   Staphylococcus aureus NEGATIVE NEGATIVE Final    Comment:        The Xpert SA Assay (FDA approved for NASAL specimens in patients over 33 years of age), is one component of a comprehensive surveillance program.  Test performance has been validated by [facility] for patients greater than or equal to 96 year old. It is not intended to diagnose infection nor to guide or monitor treatment.      Time coordinating discharge: Over 30 minutes  SIGNED:   Jacquelin Hawking, MD [facility] 04/20/2016, 1:04 PM Pager [phone]  If 7PM-7AM, please contact night-coverage [email or web address] Password [email or web address]

## 2016-04-20 NOTE — Progress Notes (Signed)
Ortho note  Plan for non operative management of the right femur fracture given hardware is stable.   Ok for 50 % weight bearing to the right leg with a walker and no active abduction (WB status updated).  Ok for diet and PT/OT.  Full consultation note to follow.

## 2016-04-20 NOTE — Progress Notes (Signed)
Initial Nutrition Assessment  DOCUMENTATION CODES:   Severe malnutrition in context of chronic illness  INTERVENTION:  Ensure Enlive TID between meals. Each supplement provides 350 kcal and 20 grams of protein.   NUTRITION DIAGNOSIS:   Malnutrition related to chronic illness as evidenced by severe depletion of muscle mass, severe depletion of body fat, percent weight loss (22).   GOAL:   Patient will meet greater than or equal to 90% of their needs   MONITOR:   Supplement acceptance, I & O's, Skin, Labs, Weight trends, PO intake  REASON FOR ASSESSMENT:   Consult, Malnutrition Screening Tool Assessment of nutrition requirement/status  ASSESSMENT:   66 y.o. male with PMHx of a-fib, bipolar 1 and depression who presents to the Emergency Department s/p fall that occurred PTA. Sister reports that pt slipped at home and fell unwitnessed. Pt lives at assisted living, and his sister reports that she came to help him shower today, and he was on the ground and had likely been there for 2 hours. Pt complains of pain to the right hip. She states that pt stays in bed most of the day. Pt then rolled out of the bed on to the floor. Sister reports that pt sustained wounds to his right elbow and left knee. [facility]. Pt left skilled nursing last week. Per CT patient has right femur fracture.  Patient provided very short responses and did not elaborate very much. Patient reported UBW of 140 lb. Per chart, patient has lost 22 % (33 lb) over the past month which is significant for this time frame. Patient was unsure of what caused this weight loss but did report he had lost weight (unsure of the amount). Patient reported appetite PTA was good and that he consumed 3 meals/day (unable to explain what he ate at those meals). Patient reported no problems chewing or swallowing and no current nausea, vomiting, or abdominal pain.   Patient agreed to drinking Ensure Enlive to  supplement current diet with extra protein and calories.  Meds Reviewed: MVI, Colace, Pravastatin,   Labs Reviewed: CBG 142 (H)  Nutrition focused physical exam performed. Findings were severe muscle and severe fat depletion.  Diet Order:  Diet Heart Room service appropriate? Yes; Fluid consistency: Thin  Skin:  Wound (see comment) (Stage II Pressure Injury- right ankle, non pressure skin tear right elbow, left knee)  Last BM:  3/24  Height:   Ht Readings from Last 1 Encounters:  04/19/16 5\' 8"  (1.727 m)    Weight:   Wt Readings from Last 1 Encounters:  04/19/16 115 lb (52.2 kg)    Ideal Body Weight:  70 kg  BMI:  Body mass index is 17.49 kg/m.  Estimated Nutritional Needs:   Kcal:  1600-1900  Protein:  79-104 grams/day  Fluid:  >/=1.5 L/day  EDUCATION NEEDS:   No education needs identified at this time  Rex KrasGrace Ann Priseis Cratty M.S. Nutrition Dietetic Intern

## 2016-04-20 NOTE — Consult Note (Signed)
WOC Nurse wound consult note Reason for Consult: skin tears right elbow/left knee Wound type: skin tears secondary to a fall at home Measurement: Right elbow: 0.5cm x 3.5cm x 0.1cm; 100% pink, clean Left elbow: 3.5cm x 1.5cm x 0.1cm; 100% pink, clean Wound bed:see above Drainage (amount, consistency, odor) scant, serosanguinous  Periwound: intact, some blanchable redness around the right elbow Dressing procedure/placement/frequency: Silicone foam to the right elbow and the left knee, change every 3 days.   Discussed POC with patient and bedside nurse.  Re consult if needed, will not follow at this time. Thanks  Damarko Stitely M.D.[employer] MSN, RN,CWOCN, CNS [phone])

## 2016-04-20 NOTE — Progress Notes (Signed)
Pt telemetry removed per discontinue order. Ricardo BucyP. Ricardo Kadarious Dikes RN

## 2016-04-20 NOTE — Evaluation (Signed)
Physical Therapy Evaluation Patient Details Name: Ricardo Hendricks MRN: [id number] DOB: [birth year] Today's Date: 04/20/2016   History of Present Illness  Pt is a 66 yo male admitted after a fall and has sustained a R hip/femur fx.  Hardware is stable and orthopedics has opted for nonoperative management at this time with 50% WB and no active abduction of R hip.  Pt with history of CAD, CABG, HTN, gout, bipolar, B shoulder surgeries, afib, THA.  Pt has been in rehab recently for sepsis due to PNU.   Clinical Impression  Pt was able to walk a short distance in his hospital room with constant cues for PWB 50% and cues for correct/safest LE sequencing.  He will need to be consistantly supervised when up on his feet as his sister reports he refuses to use RW at baseline despite professional recommendations that he do so.  He seems pretty motivated by avoiding surgery on his right leg, so I am hopeful this will help with his RW compliance.  SNF for rehab would be beneficial until he can bear full weight on his right leg.  His sister reports that she was working with his physician PTA to get a hospice consult and they recommended when she spoke to that MD earlier to try to see if hospice can be contacted for a consult while he is here in the hospital.  I paged the attending MD and requested.   PT to follow acutely for deficits listed below.       Follow Up Recommendations SNF    Equipment Recommendations  Hospital bed    Recommendations for Other Services Other (comment) (family requesting hospice consult)     Precautions / Restrictions Precautions Precautions: Fall;Other (comment) (50% WB and no active abduction on R LE) Precaution Comments: Pt has difficulty recalling and following precautions for OT, however, was able to report back both precautions to PT ~ 2 hours after OT session.  Restrictions Weight Bearing Restrictions: Yes RLE Weight Bearing: Partial weight bearing RLE Partial Weight  Bearing Percentage or Pounds: 50% Other Position/Activity Restrictions: no active hip abduction R leg      Mobility  Bed Mobility Overal bed mobility: Needs Assistance Bed Mobility: Supine to Sit     Supine to sit: Mod assist;HOB elevated     General bed mobility comments: Pt is OOB in the recliner chair.   Transfers Overall transfer level: Needs assistance Equipment used: Rolling walker (2 wheeled) Transfers: Sit to/from Stand Sit to Stand: Min assist Stand pivot transfers: Mod assist;From elevated surface       General transfer comment: Min assist to support trunk during transitions.  Verbal cues for safe hand placement during transitions and assist at trunk for balance and to stabilize RW when hands transition to and from armrests on chair.   Ambulation/Gait Ambulation/Gait assistance: Min assist Ambulation Distance (Feet): 20 Feet Assistive device: Rolling walker (2 wheeled) Gait Pattern/deviations: Step-to pattern;Antalgic Gait velocity: decreased   General Gait Details: Verbal cues for safe LE sequencing, RW use, and 50% WB status.  At first, pt did not put enough force through his hands during gait to maintain 50% WB, but he actually got better at it as we went (likely due to increased pain in his right leg).           Balance Overall balance assessment: History of Falls;Needs assistance Sitting-balance support: Feet supported Sitting balance-Leahy Scale: Fair Sitting balance - Comments: relies on upper extremities to maintain trunk off of back of  recliner chair.    Standing balance support: Bilateral upper extremity supported;During functional activity Standing balance-Leahy Scale: Poor Standing balance comment: Needs support of therapist and RW.                              Pertinent Vitals/Pain Pain Assessment: Faces Faces Pain Scale: Hurts even more Pain Location: R hip an leg when up walking Pain Descriptors / Indicators:  Aching;Grimacing Pain Intervention(s): Limited activity within patient's tolerance;Monitored during session;Repositioned    Home Living Family/patient expects to be discharged to:: Assisted living               Home Equipment: Grab bars - toilet;Other (comment);Walker - 2 wheels (normal bed, family pursuing hospital bed) Additional Comments: Pt unsure of what equipment he has at home.    Prior Function Level of Independence: Needs assistance   Gait / Transfers Assistance Needed: Patient reports he is independent with ambulation and no assistive device, however, sister reports multiple falls and refuses to use RW despite family and professional recommendations.   ADL's / Homemaking Assistance Needed: Meals and housekeeping provided by facility.  Pt states his sister bathes him but he can dress himself, toilet, groom and feed self.  Unsure of accuracy as pt is a poor historian.  Comments: He has only been out of SNF for rehab for 9 days when this happened.      Hand Dominance   Dominant Hand: Right    Extremity/Trunk Assessment   Upper Extremity Assessment Upper Extremity Assessment: Defer to OT evaluation RUE Deficits / Details: AROM in shoulder flex and abdution limted to 45 degrees. RUE: Unable to fully assess due to pain RUE Coordination: decreased gross motor LUE Deficits / Details: AROM in shoulder limited to 45 Degrees.   LUE: Unable to fully assess due to pain LUE Coordination: decreased gross motor    Lower Extremity Assessment Lower Extremity Assessment: RLE deficits/detail;LLE deficits/detail RLE Deficits / Details: bil legs with significant muscle wasting.  Ankle at least 3/5, knee 3-/5 (ext), hip limited to functional testing and is 2-/5 flexion (did not actively abduct due to restrictions/precautions).  RLE Sensation:  (intact to LT) LLE Deficits / Details: left let with noticeable muscle wasting, ankle at least 3/5, knee at least 3/5, hip flexion 3-/5 per  gross functional assessment.  LLE Sensation:  (intact to LT)    Cervical / Trunk Assessment Cervical / Trunk Assessment: Kyphotic  Communication   Communication: No difficulties  Cognition Arousal/Alertness: Awake/alert Behavior During Therapy: Flat affect Overall Cognitive Status: History of cognitive impairments - at baseline                                 General Comments: Per sister this is baseline, he has h/o bipolar d/o and has been in the depressive state for 5 years now.       General Comments General comments (skin integrity, edema, etc.): Pt with multiple areas of skin tears and brakdown, sister pointed out that there was one on his right lateral ankle that had not been addressed by wound care RN.  RN notified to look at it and it was covered with a foam pressure dressing.     Exercises Total Joint Exercises Ankle Circles/Pumps: AROM;Both;20 reps Quad Sets: AROM;Both;10 reps Heel Slides: AAROM;Both;10 reps   Assessment/Plan    PT Assessment Patient needs continued PT services  PT Problem List Decreased strength;Decreased range of motion;Decreased activity tolerance;Decreased balance;Decreased mobility;Decreased cognition;Decreased knowledge of use of DME;Decreased knowledge of precautions;Pain       PT Treatment Interventions DME instruction;Gait training;Functional mobility training;Therapeutic activities;Therapeutic exercise;Balance training;Cognitive remediation;Patient/family education;Modalities    PT Goals (Current goals can be found in the Care Plan section)  Acute Rehab PT Goals Patient Stated Goal: to go home, sister wants him safer and more supervised.  PT Goal Formulation: With patient Time For Goal Achievement: 05/04/16 Potential to Achieve Goals: Good    Frequency Min 3X/week           End of Session Equipment Utilized During Treatment: Gait belt Activity Tolerance: Patient limited by pain Patient left: in chair;with call  bell/phone within reach;with chair alarm set Nurse Communication: Mobility status;Other (comment) (R lateral ankle wound.  ) PT Visit Diagnosis: Muscle weakness (generalized) (M62.81);Difficulty in walking, not elsewhere classified (R26.2);Pain Pain - Right/Left: Right Pain - part of body: Hip    Time: [id number] PT Time Calculation (min) (ACUTE ONLY): 31 min   Charges:   PT Evaluation $PT Eval Moderate Complexity: 1 Procedure PT Treatments $Gait Training: 8-22 mins          Theodor Mustin B. Nicola Heinemann, PT, DPT [phone]   04/20/2016, 11:44 AM

## 2016-04-21 ENCOUNTER — Inpatient Hospital Stay (HOSPITAL_COMMUNITY): Payer: Medicare Other

## 2016-04-21 ENCOUNTER — Encounter (HOSPITAL_COMMUNITY): Payer: Self-pay | Admitting: General Practice

## 2016-04-21 DIAGNOSIS — L89512 Pressure ulcer of right ankle, stage 2: Secondary | ICD-10-CM

## 2016-04-21 LAB — URINALYSIS, ROUTINE W REFLEX MICROSCOPIC
BILIRUBIN URINE: NEGATIVE
Glucose, UA: NEGATIVE mg/dL
KETONES UR: NEGATIVE mg/dL
NITRITE: NEGATIVE
Protein, ur: NEGATIVE mg/dL
SPECIFIC GRAVITY, URINE: 1.02 (ref 1.005–1.030)
pH: 6 (ref 5.0–8.0)

## 2016-04-21 LAB — CBC
HCT: 22.2 % — ABNORMAL LOW (ref 39.0–52.0)
HEMATOCRIT: 23.1 % — AB (ref 39.0–52.0)
HEMOGLOBIN: 7.5 g/dL — AB (ref 13.0–17.0)
HEMOGLOBIN: 7.3 g/dL — AB (ref 13.0–17.0)
MCH: 30.9 pg (ref 26.0–34.0)
MCH: 31.2 pg (ref 26.0–34.0)
MCHC: 32.5 g/dL (ref 30.0–36.0)
MCHC: 32.9 g/dL (ref 30.0–36.0)
MCV: 95.1 fL (ref 78.0–100.0)
MCV: 94.9 fL (ref 78.0–100.0)
Platelets: 156 10*3/uL (ref 150–400)
Platelets: 130 10*3/uL — ABNORMAL LOW (ref 150–400)
RBC: 2.43 MIL/uL — ABNORMAL LOW (ref 4.22–5.81)
RBC: 2.34 MIL/uL — AB (ref 4.22–5.81)
RDW: 13.7 % (ref 11.5–15.5)
RDW: 14 % (ref 11.5–15.5)
WBC: 11.3 10*3/uL — AB (ref 4.0–10.5)
WBC: 11.9 10*3/uL — ABNORMAL HIGH (ref 4.0–10.5)

## 2016-04-21 LAB — URINALYSIS, MICROSCOPIC (REFLEX)

## 2016-04-21 MED ORDER — TAMSULOSIN HCL 0.4 MG PO CAPS
0.4000 mg | ORAL_CAPSULE | Freq: Every day | ORAL | Status: DC
  Administered 2016-04-21 – 2016-04-24 (×4): 0.4 mg via ORAL
  Filled 2016-04-21 (×4): qty 1

## 2016-04-21 NOTE — Plan of Care (Signed)
Problem: Pain Managment: Goal: General experience of comfort will improve Outcome: Progressing Medicated once for pain with moderate relief  Problem: Skin Integrity: Goal: Risk for impaired skin integrity will decrease Outcome: Progressing mepilexes dry and intact to both legs abrasions  Problem: Tissue Perfusion: Goal: Risk factors for ineffective tissue perfusion will decrease Outcome: Progressing SCDs are on, no s/s dvt  Problem: Activity: Goal: Risk for activity intolerance will decrease Outcome: Progressing Resting in the bed  Problem: Fluid Volume: Goal: Ability to maintain a balanced intake and output will improve Outcome: Not Progressing Very small amount of urine output, unable to urinate, MD is paged, awaiting for response  Problem: Bowel/Gastric: Goal: Will not experience complications related to bowel motility Outcome: Progressing No gastric or bowel issues reported

## 2016-04-21 NOTE — Progress Notes (Signed)
PROGRESS NOTE    Ricardo Hendricks  WUJ:[id number] DOB: [birth year] DOA: 04/19/2016 PCP: [name], MD   Brief Narrative: Ricardo Hendricks is a 66 y.o. male with medical history significant of hypertension, hyperlipidemia, gout, depression with anxiety, atrial fibrillation on Xarelto, hernia of testicle, CAD, CABG. He presented with right hip pain after a fall and found to have a periprosthetic hip fracture.   Assessment & Plan:   Principal Problem:   Periprosthetic hip fracture Active Problems:   Essential hypertension   HLD (hyperlipidemia)   Gout   Depression with anxiety   Atrial fibrillation, chronic (HCC)   CAD (coronary artery disease)   Hypotension   Leukocytosis   Hip fracture, right, closed, initial encounter (HCC)   Fall   Anemia   Stage II pressure ulcer of right ankle   Adult failure to thrive   DNR (do not resuscitate)   Palliative care by specialist   Periprosthetic right hip fracture Sustained after slipping and falling. Seen on CT scan with minimal displacement. Orthopedic surgery consulted and recommended non-operative management. Physical therapy recommended skilled nursing facility placement for rehab -outpatient follow-up with orthopedic surgery  Leukocytosis Stress reaction. Improved.   Hypotension Present on initial presentation. Continued soft blood pressures.  Anemia Likely secondary to hematoma vs hemorrhage. Hemoglobin stabilized. Xarelto held. Big drop likely dilutional, however, still dropping.  -transfuse for hemoglobin <7 -repeat CBC in AM  Atrial fibrillation CHA2DS2-VASc Scoreis 4. Currently in atrial fibrillation with controlled rate. Discussed risks and benefits of anticoagulation.  -hold Xarelto -hold aspirin; may need to hold this at discharge as well (d/c summary recommends continuing)   Essential hypertension Continue Cardizem  Gout Continue allopurinol  Hyperlipidemia Continue  pravastatin.  Depression Anxiety Continue Klonopin and Remeron  CAD s/p CABG. No chest pain. Continue statin and held aspirin. Restart aspirin.  Acute urinary retention No associated symptoms/red flags. Likely secondary to narcotics. Blood clot likely developed after previous straight cath attempt -urology, Dr. Berneice Heinrich, recommended tamsulosin and outpatient follow-up -will obtain urinalysis and urine culture -insert foley  DVT prophylaxis: SCDs Code Status: DNR/DNI Family Communication: Sister (HCPOA) Disposition Plan: Discharge once hemoglobin is stable.   Consultants:   Orthopedic surgery  Procedures:   None  Antimicrobials:  None    Subjective: Patient reports no hip pain.  Objective: Vitals:   04/20/16 0634 04/20/16 1340 04/20/16 2100 04/21/16 0521  BP: 95/77 (!) 89/64 110/61 (!) 91/57  Pulse: 91 84 91 75  Resp: 16 16    Temp: 98.4 F (36.9 C) 97.9 F (36.6 C) 98.6 F (37 C) 97.3 F (36.3 C)  TempSrc: Oral Oral Oral Axillary  SpO2: 100% 100% 99% 96%  Weight:      Height:        Intake/Output Summary (Last 24 hours) at 04/21/16 1046 Last data filed at 04/21/16 0600  Gross per 24 hour  Intake          1166.25 ml  Output              350 ml  Net           816.25 ml   Filed Weights   04/19/16 1547  Weight: 52.2 kg (115 lb)    Examination:  General exam: Appears calm and comfortable Respiratory system: Clear to auscultation. Respiratory effort normal. Cardiovascular system: S1 & S2 heard, RRR. No murmurs, rubs, gallops or clicks. Gastrointestinal system: Abdomen is nondistended, soft and nontender. Normal bowel sounds heard. Central nervous system: Alert and oriented. Extremities: No edema.  No calf tenderness Skin: No cyanosis. No rashes Psychiatry: Judgement and insight appear normal. Mood & affect depressed and flat.     Data Reviewed: I have personally reviewed following labs and imaging studies  CBC:  Recent Labs Lab  04/19/16 1600  04/20/16 0305 04/20/16 0612 04/20/16 1146 04/20/16 1800 04/21/16 0459  WBC 23.8*  < > 14.3* 12.7* 13.4* 11.5* 11.3*  NEUTROABS 22.4*  --   --   --   --   --   --   HGB 12.8*  < > 9.1* 8.6* 9.0* 7.6* 7.5*  HCT 38.9*  < > 28.4* 26.7* 27.7* 23.2* 23.1*  MCV 93.5  < > 94.0 94.0 94.2 93.9 95.1  PLT 262  < > 197 192 195 155 156  < > = values in this interval not displayed. Basic Metabolic Panel:  Recent Labs Lab 04/19/16 1600  NA 141  K 4.6  CL 102  CO2 25  GLUCOSE 142*  BUN 20  CREATININE 1.07  CALCIUM 9.3   GFR: Estimated Creatinine Clearance: 50.1 mL/min (by C-G formula based on SCr of 1.07 mg/dL). Liver Function Tests:  Recent Labs Lab 04/19/16 1600  AST 54*  ALT 17  ALKPHOS 120  BILITOT 0.6  PROT 6.6  ALBUMIN 3.3*   No results for input(s): LIPASE, AMYLASE in the last 168 hours. No results for input(s): AMMONIA in the last 168 hours. Coagulation Profile:  Recent Labs Lab 04/20/16 0028  INR 1.56   Cardiac Enzymes:  Recent Labs Lab 04/19/16 1600  CKTOTAL 150   BNP (last 3 results) No results for input(s): PROBNP in the last 8760 hours. HbA1C: No results for input(s): HGBA1C in the last 72 hours. CBG: No results for input(s): GLUCAP in the last 168 hours. Lipid Profile: No results for input(s): CHOL, HDL, LDLCALC, TRIG, CHOLHDL, LDLDIRECT in the last 72 hours. Thyroid Function Tests: No results for input(s): TSH, T4TOTAL, FREET4, T3FREE, THYROIDAB in the last 72 hours. Anemia Panel: No results for input(s): VITAMINB12, FOLATE, FERRITIN, TIBC, IRON, RETICCTPCT in the last 72 hours. Sepsis Labs: No results for input(s): PROCALCITON, LATICACIDVEN in the last 168 hours.  Recent Results (from the past 240 hour(s))  Culture, blood (Routine X 2) w Reflex to ID Panel     Status: None (Preliminary result)   Collection Time: 04/20/16 12:28 AM  Result Value Ref Range Status   Specimen Description BLOOD RIGHT ANTECUBITAL  Final   Special  Requests BOTTLES DRAWN AEROBIC AND ANAEROBIC 5CC EA  Final   Culture NO GROWTH < 24 HOURS  Final   Report Status PENDING  Incomplete  Culture, blood (Routine X 2) w Reflex to ID Panel     Status: None (Preliminary result)   Collection Time: 04/20/16 12:30 AM  Result Value Ref Range Status   Specimen Description BLOOD RIGHT HAND  Final   Special Requests IN PEDIATRIC BOTTLE 3CC  Final   Culture NO GROWTH < 24 HOURS  Final   Report Status PENDING  Incomplete  Surgical pcr screen     Status: None   Collection Time: 04/20/16  2:00 AM  Result Value Ref Range Status   MRSA, PCR NEGATIVE NEGATIVE Final   Staphylococcus aureus NEGATIVE NEGATIVE Final    Comment:        The Xpert SA Assay (FDA approved for NASAL specimens in patients over 66 years of age), is one component of a comprehensive surveillance program.  Test performance has been validated by [facility] for patients greater  than or equal to 26 year old. It is not intended to diagnose infection nor to guide or monitor treatment.          Radiology Studies: Dg Chest 2 View  Result Date: 04/19/2016 CLINICAL DATA:  Weakness, un- witnessed fall at home EXAM: CHEST  2 VIEW COMPARISON:  03/18/2016 FINDINGS: Cardiomediastinal silhouette is stable. Hyperinflation again noted. Status post median sternotomy. No infiltrate or pulmonary edema. Osteopenia and degenerative changes thoracic spine. IMPRESSION: No active disease. Hyperinflation. Status post median sternotomy. Osteopenia and degenerative changes thoracic spine. Electronically Signed   By: Natasha Mead M.D.   On: 04/19/2016 17:05   Ct Hip Right Wo Contrast  Result Date: 04/19/2016 CLINICAL DATA:  Right hip fracture, unwitnessed fall this afternoon. Closed fracture, initial encounter. EXAM: CT OF THE RIGHT HIP WITHOUT CONTRAST TECHNIQUE: Multidetector CT imaging of the right hip was performed according to the standard protocol. Multiplanar CT image reconstructions were also  generated. COMPARISON:  Radiographs earlier this day. Reformats from abdominal CT 12/11/2015 FINDINGS: Bones/Joint/Cartilage Right hip total arthroplasty in expected alignment. Periprosthetic fracture involves the proximal femoral stem, lesser and greater trochanters. Fracture is minimally displaced. No significant angulation. No additional fracture of the right hemipelvis. Pubic rami are intact. Right sacroiliac joint and pubic symphysis are congruent. Ligaments Suboptimally assessed by CT. Muscles and Tendons Edematous heterogeneous appearance of the right iliopsoas and gluteal musculature, likely hematoma in the setting of acute injury. Soft tissues Dense vascular calcifications. Diffuse soft tissue edema. Paucity of body wall fat can be seen with cachexia. IMPRESSION: Minimally displaced periprosthetic fracture about right hip prosthesis involving the proximal femoral stem, lesser and greater trochanters. Heterogeneous enlargement of the iliopsoas and gluteal musculature likely hemorrhage/hematoma in the setting of acute injury. Electronically Signed   By: Rubye Oaks M.D.   On: 04/19/2016 22:16   Dg Knee Complete 4 Views Left  Result Date: 04/19/2016 CLINICAL DATA:  Status post fall at home, weakness EXAM: LEFT KNEE - COMPLETE 4+ VIEW COMPARISON:  None. FINDINGS: Four views of the left knee submitted. No acute fracture or subluxation. Postsurgical changes with 2 wood transverse metallic fixation screws are noted proximal tibia. Diffuse osteopenia is noted. No acute fracture or subluxation. There is diffuse narrowing of joint space. Narrowing of patellofemoral joint space. Tiny joint effusion. IMPRESSION: No acute fracture or subluxation. Diffuse osteopenia. Osteoarthritic changes as described above. Spot tiny joint effusion. Postsurgical changes with metallic fixation material proximal left tibia. Electronically Signed   By: Natasha Mead M.D.   On: 04/19/2016 17:07   Dg Hip Unilat W Or Wo Pelvis 2-3  Views Right  Result Date: 04/19/2016 CLINICAL DATA:  Pain after fall. EXAM: DG HIP (WITH OR WITHOUT PELVIS) 2-3V RIGHT COMPARISON:  December 11, 2015 CT scan FINDINGS: The patient is status post right hip replacement. The acetabular component is in good position as is the femoral component. There is a step-off/contour abnormality inferior to the right greater trochanter suggesting acute fracture. Subtle step-off in the region of the inferior trochanter. No other fractures are identified. IMPRESSION: The findings are most consistent with an acute fracture seen just inferior to the right greater trochanter and medially at the level of the inferior trochanter. Electronically Signed   By: Gerome Sam III M.D   On: 04/19/2016 17:11   Dg Femur Min 2 Views Right  Result Date: 04/19/2016 CLINICAL DATA:  Fall today. Confirmed right hip fx on earlier films. EXAM: RIGHT FEMUR 2 VIEWS COMPARISON:  Right hip same day  FINDINGS: Three views of the right femur submitted. Study is limited by diffuse osteopenia. Right hip prosthesis partially visualized. No distal femoral fracture is noted. Again noted nondisplaced fracture in proximal right femur just inferior to greater femoral trochanter region. IMPRESSION: Study is limited by diffuse osteopenia. Right hip prosthesis partially visualized. No distal femoral fracture is noted. Again noted nondisplaced fracture in proximal right femur just inferior to greater femoral trochanter region. Electronically Signed   By: Natasha Mead M.D.   On: 04/19/2016 18:55        Scheduled Meds: . allopurinol  300 mg Oral Daily  . clonazePAM  1 mg Oral TID  . diltiazem  180 mg Oral Daily  . docusate sodium  100 mg Oral QHS  . feeding supplement (ENSURE ENLIVE)  237 mL Oral TID BM  . multivitamin with minerals  1 tablet Oral Daily  . pravastatin  40 mg Oral q1800   Continuous Infusions: . sodium chloride 75 mL/hr at 04/21/16 1024     LOS: 2 days     Jacquelin Hawking [facility] 04/21/2016, 10:46 AM Pager: [phone]  If 7PM-7AM, please contact night-coverage [email or web address] Password [facility] 04/21/2016, 10:46 AM

## 2016-04-21 NOTE — Progress Notes (Signed)
  This NP visited patient at the bedside with sister/Noel as a follow up to  yesterday's GOCs meeting.  Discussed with patient and his family  the importance of continued conversation with family and their  medical providers regarding overall plan of care and treatment options,  ensuring decisions are within the context of the patients values and GOCs.  MOST form completed   Goal is return to SNF for another attempt at rehab  Questions and concerns addressed  Time in   0950        Time out    1025  Total time spent on unit 35 min  Greater than 50% of the time was spent in counseling and coordination of care  Lorinda CreedMary Eziah Negro NP  Palliative Medicine Team Team Phone # [phone] Pager [phone]  Patient ID: Ricardo Hendricks, male   DOB: January 21, 1951, 66 y.o.   MRN: [id number]

## 2016-04-21 NOTE — Progress Notes (Signed)
Patient was unable to urine, condom catheter was placed but no urine output was collected in the drainage bag, patient's bladder was scanned with 500 ml of urine in the bladder,  MD on call was paged and ordered I/O catheter, catheter was inserted without any urine back, MD was paged again, new order was received for Foley catheter. Foley catheter was inserted by [place] Weaver, RN, small amount or bloody urine came out with some blood clot around urethral, catheter was secured with leg strap, patient tolerated well,  more red color urine drained without clot for the total of 350 ml at 0600 *

## 2016-04-22 ENCOUNTER — Inpatient Hospital Stay (HOSPITAL_COMMUNITY): Payer: Medicare Other

## 2016-04-22 LAB — BASIC METABOLIC PANEL
ANION GAP: 6 (ref 5–15)
BUN: 13 mg/dL (ref 6–20)
CALCIUM: 8.1 mg/dL — AB (ref 8.9–10.3)
CO2: 27 mmol/L (ref 22–32)
Chloride: 106 mmol/L (ref 101–111)
Creatinine, Ser: 0.56 mg/dL — ABNORMAL LOW (ref 0.61–1.24)
GFR calc Af Amer: >60 mL/min (ref >60)
GFR calc non Af Amer: >60 mL/min (ref >60)
GLUCOSE: 96 mg/dL (ref 65–99)
Potassium: 4.4 mmol/L (ref 3.5–5.1)
Sodium: 139 mmol/L (ref 135–145)

## 2016-04-22 LAB — CBC
HEMATOCRIT: 21.8 % — AB (ref 39.0–52.0)
Hemoglobin: 7.2 g/dL — ABNORMAL LOW (ref 13.0–17.0)
MCH: 31.2 pg (ref 26.0–34.0)
MCHC: 33 g/dL (ref 30.0–36.0)
MCV: 94.4 fL (ref 78.0–100.0)
Platelets: 141 10*3/uL — ABNORMAL LOW (ref 150–400)
RBC: 2.31 MIL/uL — ABNORMAL LOW (ref 4.22–5.81)
RDW: 13.9 % (ref 11.5–15.5)
WBC: 9.7 10*3/uL (ref 4.0–10.5)

## 2016-04-22 LAB — URINE CULTURE: Culture: NO GROWTH

## 2016-04-22 LAB — PREPARE RBC (CROSSMATCH)

## 2016-04-22 LAB — STREP PNEUMONIAE URINARY ANTIGEN: STREP PNEUMO URINARY ANTIGEN: NEGATIVE

## 2016-04-22 MED ORDER — IPRATROPIUM-ALBUTEROL 0.5-2.5 (3) MG/3ML IN SOLN: 3.0000 mL | Freq: Four times a day (QID) | RESPIRATORY_TRACT | Status: DC

## 2016-04-22 MED ORDER — FUROSEMIDE 10 MG/ML IJ SOLN
20.0000 mg | Freq: Once | INTRAMUSCULAR | Status: AC
  Administered 2016-04-22: 20 mg via INTRAVENOUS
  Filled 2016-04-22: qty 2

## 2016-04-22 MED ORDER — FUROSEMIDE 10 MG/ML IJ SOLN
20.0000 mg | Freq: Every day | INTRAMUSCULAR | Status: DC
  Filled 2016-04-22: qty 2

## 2016-04-22 MED ORDER — LEVOFLOXACIN 500 MG PO TABS
750.0000 mg | ORAL_TABLET | ORAL | Status: DC
  Administered 2016-04-22 – 2016-04-24 (×3): 750 mg via ORAL
  Filled 2016-04-22 (×3): qty 2

## 2016-04-22 MED ORDER — SODIUM CHLORIDE 0.9 % IV SOLN
Freq: Once | INTRAVENOUS | Status: AC
  Administered 2016-04-22: 15:00:00 via INTRAVENOUS

## 2016-04-22 MED ORDER — IPRATROPIUM-ALBUTEROL 0.5-2.5 (3) MG/3ML IN SOLN: 3.0000 mL | RESPIRATORY_TRACT | Status: DC | PRN

## 2016-04-22 NOTE — Progress Notes (Signed)
This morning on assessment, RN noted pt to sound very congested. He was encourgaged to deep breathe and educated on incentive spirometer (only achieved 500). Pt was never able to cough any sputum up, he denied SOB. Pt's oral temp was 99, O2 sats on room air ranged between 85-88%, and his heart rate ranged between 110's-130s. Dr. Izola PriceMyers notified. Gave 1 dose of IV lasix, and chest CT ordered. Pt had urine output of 1100 over next few hours. Will continue to monitor.  [place], Latricia HeftKorie G

## 2016-04-22 NOTE — Progress Notes (Addendum)
PROGRESS NOTE  Ricardo Hendricks  ZOX:[id number] DOB: [birth year] DOA: 04/19/2016  PCP: [name], MD  Brief Narrative: 66 y.o. male with medical history significant of hypertension, hyperlipidemia, gout, depression with anxiety, atrial fibrillation on Xarelto, hernia of testicle, CAD, CABG. He presented with right hip pain after a fall and found to have a periprosthetic hip fracture.  Assessment & Plan:   Periprosthetic right hip fracture - Sustained after slipping and falling. Seen on CT scan with minimal displacement.  - Orthopedic surgery consulted and recommended non-operative management.  - Physical therapy recommended skilled nursing facility placement for rehab - outpatient follow-up with orthopedic surgery also recommended   Dyspnea with hypoxia - oxygen saturation in 80's on RA this AM requiring placement on oxygen via [place] - pt with crackles on exam - no IVF, asked for lasix 20 mg IV to be given once  - CT chest pending as well, rule out PNA as pt with low grade fever 70F and WBC up to 11.9 - place on empiric Levaquin for now, PNA order set in place just in case until CT chest results are back   Leukocytosis - possibly related to an infectious etiology but looks like now trending down and WNL this AM  - CT chest as noted above - CBC in AM   Hypotension - SBP remains in low 100's - monitor for now   Anemia - Likely secondary to hematoma vs hemorrhage - Xarelto held - needs one U PRBC transfusion today - CBC in AM  Atrial fibrillation - CHA2DS2-VASc Scoreis 4. Currently in atrial fibrillation with rate in low 100's - Discussed risks and benefits of anticoagulation.  - hold Xarelto - hold aspirin; may need to hold this at discharge as well - continue Cardizem but further increase in the dose is limited due to hypotension   Essential hypertension - Continue Cardizem  Gout - Continue allopurinol  Hyperlipidemia - Continue  pravastatin.  Thrombocytopenia - mild, monitor for now - CBC in AM  Depression. Anxiety - Continue Klonopin and Remeron  CAD - s/p CABG. No chest pain. - Continue statin   Acute urinary retention - No associated symptoms/red flags. Likely secondary to narcotics. Blood clot likely developed after previous straight cath attempt - urology, Dr. Berneice Heinrich, recommended tamsulosin and outpatient follow-up - foley in place   DVT prophylaxis: SCDs Code Status: DNR/DNI Family Communication: no family at bedside thsi AM  Disposition Plan: Discharge once hemoglobin is stable.  Consultants:   Orthopedic surgery  Palliative care   Procedures:   None  Antimicrobials:  Levaquin 3/28 -->  Subjective: Patient reports no hip pain.  Objective: Vitals:   04/21/16 1300 04/21/16 1942 04/22/16 0500 04/22/16 1211  BP: 103/67 108/70 109/69   Pulse: 76 72 70 (!) 112  Resp:  16 16 18   Temp: 98.9 F (37.2 C) 99 F (37.2 C) 98.6 F (37 C) 99 F (37.2 C)  TempSrc: Oral Oral Oral Oral  SpO2: 96% 98% 98% (!) 87%  Weight:      Height:        Intake/Output Summary (Last 24 hours) at 04/22/16 1300 Last data filed at 04/22/16 1100  Gross per 24 hour  Intake             3405 ml  Output              901 ml  Net             2504 ml   [employer]  04/19/16 1547  Weight: 52.2 kg (115 lb)    Examination:  General exam: Appears calm and comfortable Respiratory system: Crackles at bases, diminished breath sounds bilaterally  Cardiovascular system: IRRR. No rubs, gallops or clicks. Gastrointestinal system: Abdomen is nondistended, soft and nontender. Normal bowel sounds heard. Central nervous system: Alert and oriented.  Data Reviewed: I have personally reviewed following labs and imaging studies  CBC:  Recent Labs Lab 04/19/16 1600  04/20/16 1146 04/20/16 1800 04/21/16 0459 04/21/16 1102 04/22/16 0228  WBC 23.8*  < > 13.4* 11.5* 11.3* 11.9* 9.7  NEUTROABS 22.4*  --   --    --   --   --   --   HGB 12.8*  < > 9.0* 7.6* 7.5* 7.3* 7.2*  HCT 38.9*  < > 27.7* 23.2* 23.1* 22.2* 21.8*  MCV 93.5  < > 94.2 93.9 95.1 94.9 94.4  PLT 262  < > 195 155 156 130* 141*  < > = values in this interval not displayed. Basic Metabolic Panel:  Recent Labs Lab 04/19/16 1600 04/22/16 0228  NA 141 139  K 4.6 4.4  CL 102 106  CO2 25 27  GLUCOSE 142* 96  BUN 20 13  CREATININE 1.07 0.56*  CALCIUM 9.3 8.1*   Liver Function Tests:  Recent Labs Lab 04/19/16 1600  AST 54*  ALT 17  ALKPHOS 120  BILITOT 0.6  PROT 6.6  ALBUMIN 3.3*   Coagulation Profile:  Recent Labs Lab 04/20/16 0028  INR 1.56   Cardiac Enzymes:  Recent Labs Lab 04/19/16 1600  CKTOTAL 150   Recent Results (from the past 240 hour(s))  Culture, blood (Routine X 2) w Reflex to ID Panel     Status: None (Preliminary result)   Collection Time: 04/20/16 12:28 AM  Result Value Ref Range Status   Specimen Description BLOOD RIGHT ANTECUBITAL  Final   Special Requests BOTTLES DRAWN AEROBIC AND ANAEROBIC 5CC EA  Final   Culture NO GROWTH 1 DAY  Final   Report Status PENDING  Incomplete  Culture, blood (Routine X 2) w Reflex to ID Panel     Status: None (Preliminary result)   Collection Time: 04/20/16 12:30 AM  Result Value Ref Range Status   Specimen Description BLOOD RIGHT HAND  Final   Special Requests IN PEDIATRIC BOTTLE 3CC  Final   Culture NO GROWTH 1 DAY  Final   Report Status PENDING  Incomplete  Surgical pcr screen     Status: None   Collection Time: 04/20/16  2:00 AM  Result Value Ref Range Status   MRSA, PCR NEGATIVE NEGATIVE Final   Staphylococcus aureus NEGATIVE NEGATIVE Final  Culture, Urine     Status: None   Collection Time: 04/21/16  9:34 AM  Result Value Ref Range Status   Specimen Description URINE, CATHETERIZED  Final   Special Requests NONE  Final   Culture NO GROWTH  Final   Report Status 04/22/2016 FINAL  Final    Radiology Studies: Dg Abd Portable 1v  Result Date:  04/21/2016 CLINICAL DATA:  Constipation EXAM: PORTABLE ABDOMEN - 1 VIEW COMPARISON:  None. FINDINGS: Scattered large and small bowel gas is noted. Mild fecal material is noted within the colon consistent with constipation. No obstructive changes are seen. Postsurgical changes in the right hip are noted. IMPRESSION: Mild constipation. Electronically Signed   By: Alcide CleverMark  Lukens M.D.   On: 04/21/2016 12:29    Scheduled Meds: . sodium chloride   Intravenous Once  . allopurinol  300 mg Oral Daily  . clonazePAM  1 mg Oral TID  . diltiazem  180 mg Oral Daily  . docusate sodium  100 mg Oral QHS  . feeding supplement (ENSURE ENLIVE)  237 mL Oral TID BM  . multivitamin with minerals  1 tablet Oral Daily  . pravastatin  40 mg Oral q1800  . tamsulosin  0.4 mg Oral Daily   Continuous Infusions: . sodium chloride 75 mL/hr at 04/22/16 1109    LOS: 3 days   MAGICK-Loreena Valeri [facility] 04/22/2016, 1:00 PM Pager: [phone]  If 7PM-7AM, please contact night-coverage [email or web address] Password [email or web address] 04/22/2016, 1:00 PM

## 2016-04-22 NOTE — Progress Notes (Signed)
Nutrition Follow-up  DOCUMENTATION CODES:   Severe malnutrition in context of chronic illness  INTERVENTION:   Calorie count beginning lunch 3/28 until lunch 3/29.  Continue Ensure Enlive TID between meals. Each supplement provides 350 kcal and 20 grams of protein.  NUTRITION DIAGNOSIS:   Malnutrition related to chronic illness as evidenced by severe depletion of muscle mass, severe depletion of body fat, percent weight loss (22).  Ongoing  GOAL:   Patient will meet greater than or equal to 90% of their needs  Progressing  MONITOR:   Supplement acceptance, I & O's, Skin, Labs, Weight trends, PO intake   ASSESSMENT:   66 y.o. male with PMHx of a-fib, bipolar 1 and depression who presents to the Emergency Department s/p fall that occurred PTA. Sister reports that pt slipped at home and fell unwitnessed. Pt lives at assisted living, and his sister reports that she came to help him shower today, and he was on the ground and had likely been there for 2 hours. Pt complains of pain to the right hip. She states that pt stays in bed most of the day. Pt then rolled out of the bed on to the floor. Sister reports that pt sustained wounds to his right elbow and left knee. [facility] last [facility]. Pt left skilled nursing last week. Per CT patient has right femur fracture.   Patient reported he ate two bananas and two yogurts for lunch yesterday. Per RN intake has been poor. Per chart, patient consumed 90% of breakfast this morning. Discussed calorie count procedures with RN. Calorie count began at lunchtime today (3/28) and will continue through lunchtime tomorrow (3/29).  Meds Reviewed: Colace, Lasix, MVI, Provastatin  Labs Reviewed: Calcium 8.1 (L)  Diet Order:  Diet Heart Room service appropriate? Yes; Fluid consistency: Thin  Skin:  Wound (see comment) (Stage II Pressure Injury- right ankle, non pressure skin tear right elbow, left knee)  Last BM:  3/24  Height:    Ht Readings from Last 1 Encounters:  04/19/16 5\' 8"  (1.727 m)    Weight:   Wt Readings from Last 1 Encounters:  04/19/16 115 lb (52.2 kg)    Ideal Body Weight:  70 kg  BMI:  Body mass index is 17.49 kg/m.  Estimated Nutritional Needs:   Kcal:  1600-1900  Protein:  79-104 grams/day  Fluid:  >/=1.5 L/day  EDUCATION NEEDS:   No education needs identified at this time  Ricardo Hendricks M.S. Nutrition Dietetic Intern

## 2016-04-22 NOTE — Progress Notes (Signed)
Responded to consult to create advanced directive and for palliative and major life transitions. Nurse was unavailable, and pt briefly spoke w/ me in darkened rm when he was just planning to nap. He requested a return visit in the morning. Chaplain available for f/u.    04/22/16 1500  Clinical Encounter Type  Visited With Patient;Patient not available  Visit Type Initial;Psychological support;Spiritual support;Social support  Referral From Nurse  Spiritual Encounters  Spiritual Needs Brochure;Emotional  Stress Factors  Patient Stress Factors Health changes;Loss of control   Ephraim Hamburgerynthia A Avyukth Bontempo, [address]

## 2016-04-23 DIAGNOSIS — M978XXD Periprosthetic fracture around other internal prosthetic joint, subsequent encounter: Secondary | ICD-10-CM

## 2016-04-23 LAB — CBC
HCT: 25.9 % — ABNORMAL LOW (ref 39.0–52.0)
Hemoglobin: 8.3 g/dL — ABNORMAL LOW (ref 13.0–17.0)
MCH: 29.5 pg (ref 26.0–34.0)
MCHC: 32 g/dL (ref 30.0–36.0)
MCV: 92.2 fL (ref 78.0–100.0)
PLATELETS: 147 10*3/uL — AB (ref 150–400)
RBC: 2.81 MIL/uL — AB (ref 4.22–5.81)
RDW: 14.5 % (ref 11.5–15.5)
WBC: 12.3 10*3/uL — AB (ref 4.0–10.5)

## 2016-04-23 LAB — BASIC METABOLIC PANEL
ANION GAP: 10 (ref 5–15)
BUN: 13 mg/dL (ref 6–20)
CO2: 27 mmol/L (ref 22–32)
Calcium: 8.2 mg/dL — ABNORMAL LOW (ref 8.9–10.3)
Chloride: 100 mmol/L — ABNORMAL LOW (ref 101–111)
Creatinine, Ser: 0.64 mg/dL (ref 0.61–1.24)
GFR calc Af Amer: >60 mL/min (ref >60)
GLUCOSE: 116 mg/dL — AB (ref 65–99)
POTASSIUM: 3.7 mmol/L (ref 3.5–5.1)
Sodium: 137 mmol/L (ref 135–145)

## 2016-04-23 LAB — TYPE AND SCREEN
ABO/RH(D): O POS
Antibody Screen: NEGATIVE
UNIT DIVISION: 0

## 2016-04-23 LAB — BPAM RBC
BLOOD PRODUCT EXPIRATION DATE: 201804272359
ISSUE DATE / TIME: 201803281504
UNIT TYPE AND RH: 5100

## 2016-04-23 NOTE — Progress Notes (Addendum)
3:30pm Per [facility] liaison pt is not appropriate for inpatient hospice at this time- family interested in SNF.  [facility] will not have availability till next week- CSW provided list of alternate options for family to review  12:30pm CSW received call from [facility] stating pt sister Danelle Earthlyoel and her spouse visited their facility and interested in possible hospice home placement for patient.  Pt sister requested [facility] come out and evaluate patient for transfer to their facility- liaison will come out this afternoon to evaluate if possible.  CSW will continue to follow and assist with transfer to appropriate setting  Burna SisJenna H. Daryon Remmert, LCSW Clinical Social Worker [phone]

## 2016-04-23 NOTE — Progress Notes (Signed)
Calorie Count Note:  24 hour calorie count ordered.  Diet: Heart Healthy  Supplements: Ensure Enlive TID between meals. Each supplement provides 350 kcal and 20 grams of protein.  Breakfast: 230 kcal, 3 grams protein Lunch: 892 kcal, 25 grams protein Dinner: 728 kcal, 20 grams protein Supplements: 1400 kcal, 80 grams protein  Total intake  3250  kcal (203 % of minimum estimated needs) 128 grams protein (162 % of minimum estimated needs)    Nutrition Dx: Malnutrition related to chronic illness as evidenced by severe depletion of muscle mass, severe depletion of body fat, 22% weight loss. -Ongoing  Goal: Patient will meet greater than or equal to 90% of their needs. -Met  Intervention: Calorie count beginning lunch 3/28 until lunch 3/29.  Continue Ensure Enlive TID between meals. Each supplement provides 350 kcal and 20 grams of protein.  Patient is meeting his nutrition needs. Will discontinue calorie count.   Juliann Pulse M.S. Nutrition Dietetic Intern

## 2016-04-23 NOTE — Progress Notes (Signed)
qPhysical Therapy Treatment Patient Details Name: Ricardo Hendricks MRN: [id number] DOB: 1950/01/27 Today's Date: 04/23/2016    History of Present Illness Pt is a 66 yo male admitted after a fall and has sustained a R hip/femur fx.  Hardware is stable and orthopedics has opted for nonoperative management at this time with 50% WB and no active abduction of R hip.  Pt with history of CAD, CABG, HTN, gout, bipolar, B shoulder surgeries, afib, THA.  Pt has been in rehab recently for sepsis due to PNU. Pt found to be hypoxic as well this admission with leukocytosis.  CT of chest noteable for small pulmonary nodules tx with ABX.      PT Comments    Pt was able to walk around the end of the bed and get into the chair.  Lights turned on to help him not get his days and nights mixed up (he was in the pitch dark when I entered the room).  Pt able to report his precautions back to me, but has difficulty processing some of my commands and needs increased processing time as well as multimodal cues.  PT will continue to follow acutely and he remains appropriate for SNF at discharge.   Follow Up Recommendations  SNF     Equipment Recommendations  Hospital bed    Recommendations for Other Services   NA     Precautions / Restrictions Precautions Precautions: Fall;Other (comment) Precaution Comments: no active hip abduction R Leg, pt able to report precaution and correct WB status to me today Restrictions Weight Bearing Restrictions: Yes RLE Weight Bearing: Partial weight bearing RLE Partial Weight Bearing Percentage or Pounds: 50%    Mobility  Bed Mobility Overal bed mobility: Needs Assistance Bed Mobility: Supine to Sit     Supine to sit: HOB elevated;Mod assist     General bed mobility comments: Mod assist to help progress both legs over EOB.  Pt needs some extra time to process commands and multimodal cues for sequencing.  HOB elevated and pt relying heavily on rails to help pull to sitting.    Transfers Overall transfer level: Needs assistance Equipment used: Rolling walker (2 wheeled) Transfers: Sit to/from Stand Sit to Stand: Mod assist         General transfer comment: Mod assist to support trunk to get to standing pt weight shifted to the left (likely due to pain).    Ambulation/Gait Ambulation/Gait assistance: Mod assist Ambulation Distance (Feet): 20 Feet Assistive device: Rolling walker (2 wheeled) Gait Pattern/deviations: Step-to pattern;Antalgic Gait velocity: decreased Gait velocity interpretation: Below normal speed for age/gender General Gait Details: Mod assist to support trunk for balance during gait.  Slower gait speed.  Pt continues to weight shfit more to the left during gait likely due to pain despite no reports of pain from pt.  Moderately antalgic gait pattern and assist to support trunk for balance.  No DOE during gait, and O2 sats and HR stable on RA (94%, 110 bpm)          Balance Overall balance assessment: Needs assistance Sitting-balance support: Feet supported;Bilateral upper extremity supported Sitting balance-Leahy Scale: Poor Sitting balance - Comments: min assist EOB, posterior left lean Postural control: Posterior lean;Left lateral lean Standing balance support: Bilateral upper extremity supported Standing balance-Leahy Scale: Poor Standing balance comment: mod assist at trunk and support needed also from RW  Cognition Arousal/Alertness: Awake/alert Behavior During Therapy: Flat affect Overall Cognitive Status: History of cognitive impairments - at baseline                                        Exercises Total Joint Exercises Ankle Circles/Pumps: AROM;Both;20 reps Quad Sets:  (tried, but could not understand what to do) Short Arc Quad: AROM;Both;10 reps Long Arc Quad: AROM;Both;10 reps        Pertinent Vitals/Pain Pain Assessment: Faces Faces Pain Scale: Hurts  little more Pain Location: right leg with PWB during gait Pain Descriptors / Indicators: Aching;Grimacing Pain Intervention(s): Limited activity within patient's tolerance;Monitored during session;Repositioned           PT Goals (current goals can now be found in the care plan section) Acute Rehab PT Goals Patient Stated Goal: to go home, sister wants him safer and more supervised.  Progress towards PT goals: Progressing toward goals    Frequency    Min 3X/week      PT Plan Current plan remains appropriate       End of Session Equipment Utilized During Treatment: Gait belt Activity Tolerance: Patient limited by pain;Patient limited by fatigue Patient left: in chair;with call bell/phone within reach Nurse Communication: Mobility status PT Visit Diagnosis: Muscle weakness (generalized) (M62.81);Difficulty in walking, not elsewhere classified (R26.2);Pain Pain - Right/Left: Right Pain - part of body: Hip     Time: 1610-9604 PT Time Calculation (min) (ACUTE ONLY): 26 min  Charges:  $Gait Training: 8-22 mins $Therapeutic Exercise: 8-22 mins                         Gurinder Toral B. Jebediah Macrae, PT, DPT [phone]   04/23/2016, 3:23 PM

## 2016-04-23 NOTE — Progress Notes (Addendum)
PROGRESS NOTE  Ricardo Hendricks  WUJ:[id number] DOB: 10/16/1950 DOA: 04/19/2016  PCP: [name], MD  Brief Narrative: 66 y.o. male with medical history significant of hypertension, hyperlipidemia, gout, depression with anxiety, atrial fibrillation on Xarelto, hernia of testicle, CAD, CABG. He presented with right hip pain after a fall and found to have a periprosthetic hip fracture.  Assessment & Plan:   Periprosthetic right hip fracture - Sustained after slipping and falling. Seen on CT scan with minimal displacement.  - Orthopedic surgery consulted and recommended non-operative management.  - Physical therapy recommended skilled nursing facility placement for rehab - outpatient follow-up with orthopedic surgery also recommended   Dyspnea with hypoxia - still requiring oxygen via [place] - on exam still with rhonchi bilaterally  - lasix given 20 mg IV x 1 dose and pt reports feeling bit better this AM  - CT chest requested and notable for small pulm nodules, ? Atypical infection vs lymphangitis tumor spread, vs autoimmune illness - I have placed pt on empiric Levaquin and PCCM consulted over the phone, no further recommendations other than ABX coverage as we are doing already - I have also discussed this with pt's POA his sister who is very clear that goal is for no further interventions and focus of comfort, OK with ABX but open to residential hospice as well  - continue Levaquin day #2/7  Leukocytosis - possibly related to an infectious etiology  - CT chest as noted above - continue Levaquin day #2 - CBC in AM   Hypotension - SBP remains in low 100's - monitor for now   Anemia - Likely secondary to hematoma vs hemorrhage - Xarelto held - transfused one U PRBC 3/28 and Hg up appropriately  - CBC in AM  Atrial fibrillation - CHA2DS2-VASc Scoreis 4. Currently in atrial fibrillation with rate in low 100's - Discussed risks and benefits of anticoagulation.  - holding  Xarelto - hold aspirin; may need to hold this at discharge as well - continue Cardizem but further increase in the dose is limited due to hypotension   Essential hypertension - Continue Cardizem  Gout - Continue allopurinol  Hyperlipidemia - Continue pravastatin.  Thrombocytopenia - improving  - CBC in AM  Depression. Anxiety - Continue Klonopin and Remeron  CAD - s/p CABG. No chest pain. - Continue statin   Acute urinary retention - No associated symptoms/red flags. Likely secondary to narcotics. Blood clot likely developed after previous straight cath attempt - urology, Dr. Berneice Heinrich, recommended tamsulosin and outpatient follow-up - foley in place   DVT prophylaxis: SCDs Code Status: DNR/DNI Family Communication: sister at bedside  Disposition Plan: Discharge in 1-2 days   Consultants:   Orthopedic surgery  Palliative care   Procedures:   None  Antimicrobials:  Levaquin 3/28 -->  Subjective: Patient reports no hip pain.  Objective: Vitals:   04/22/16 1726 04/22/16 1954 04/23/16 0447 04/23/16 0900  BP: 107/66 108/63 104/66 95/69  Pulse: 93 78 99 (!) 112  Resp: 18 18 16    Temp: 99.1 F (37.3 C) 100 F (37.8 C) 97.9 F (36.6 C)   TempSrc: Oral Oral Oral   SpO2: 93% 93% 95% 97%  Weight:   63.6 kg (140 lb 3.2 oz)   Height:        Intake/Output Summary (Last 24 hours) at 04/23/16 0958 Last data filed at 04/23/16 0447  Gross per 24 hour  Intake          1032.92 ml  Output  1725 ml  Net          -692.08 ml   Filed Weights   04/19/16 1547 04/22/16 1300 04/23/16 0447  Weight: 52.2 kg (115 lb) 62.3 kg (137 lb 4.8 oz) 63.6 kg (140 lb 3.2 oz)    Examination:  General exam: Appears calm and comfortable Respiratory system: Rhonchi at bases, diminished breath sounds bilaterally  Cardiovascular system: IRRR. No rubs, gallops or clicks. Gastrointestinal system: Abdomen is nondistended, soft and nontender. Normal bowel sounds  heard. Central nervous system: Alert and oriented.  Data Reviewed: I have personally reviewed following labs and imaging studies  CBC:  Recent Labs Lab 04/19/16 1600  04/20/16 1800 04/21/16 0459 04/21/16 1102 04/22/16 0228 04/23/16 0800  WBC 23.8*  < > 11.5* 11.3* 11.9* 9.7 12.3*  NEUTROABS 22.4*  --   --   --   --   --   --   HGB 12.8*  < > 7.6* 7.5* 7.3* 7.2* 8.3*  HCT 38.9*  < > 23.2* 23.1* 22.2* 21.8* 25.9*  MCV 93.5  < > 93.9 95.1 94.9 94.4 92.2  PLT 262  < > 155 156 130* 141* 147*  < > = values in this interval not displayed. Basic Metabolic Panel:  Recent Labs Lab 04/19/16 1600 04/22/16 0228 04/23/16 0800  NA 141 139 137  K 4.6 4.4 3.7  CL 102 106 100*  CO2 25 27 27   GLUCOSE 142* 96 116*  BUN 20 13 13   CREATININE 1.07 0.56* 0.64  CALCIUM 9.3 8.1* 8.2*   Liver Function Tests:  Recent Labs Lab 04/19/16 1600  AST 54*  ALT 17  ALKPHOS 120  BILITOT 0.6  PROT 6.6  ALBUMIN 3.3*   Coagulation Profile:  Recent Labs Lab 04/20/16 0028  INR 1.56   Cardiac Enzymes:  Recent Labs Lab 04/19/16 1600  CKTOTAL 150   Recent Results (from the past 240 hour(s))  Culture, blood (Routine X 2) w Reflex to ID Panel     Status: None (Preliminary result)   Collection Time: 04/20/16 12:28 AM  Result Value Ref Range Status   Specimen Description BLOOD RIGHT ANTECUBITAL  Final   Special Requests BOTTLES DRAWN AEROBIC AND ANAEROBIC 5CC EA  Final   Culture NO GROWTH 1 DAY  Final   Report Status PENDING  Incomplete  Culture, blood (Routine X 2) w Reflex to ID Panel     Status: None (Preliminary result)   Collection Time: 04/20/16 12:30 AM  Result Value Ref Range Status   Specimen Description BLOOD RIGHT HAND  Final   Special Requests IN PEDIATRIC BOTTLE 3CC  Final   Culture NO GROWTH 1 DAY  Final   Report Status PENDING  Incomplete  Surgical pcr screen     Status: None   Collection Time: 04/20/16  2:00 AM  Result Value Ref Range Status   MRSA, PCR NEGATIVE  NEGATIVE Final   Staphylococcus aureus NEGATIVE NEGATIVE Final  Culture, Urine     Status: None   Collection Time: 04/21/16  9:34 AM  Result Value Ref Range Status   Specimen Description URINE, CATHETERIZED  Final   Special Requests NONE  Final   Culture NO GROWTH  Final   Report Status 04/22/2016 FINAL  Final    Radiology Studies: Ct Chest Wo Contrast Result Date: 04/22/2016 Diffuse interstitial prominence, with multiple scattered nodules of varying size in both lungs, most prominent at the lower lung lobes. Numerous tiny peribronchovascular nodules on the right. The appearance raises concern for  atypical infection, though lymphangitic spread of tumor could conceivably have a similar appearance. Inflammatory conditions such as Wegener's granulomatosis could also have this appearance. All this appears to be new from November, and is better seen than on the recent prior chest radiograph. 2. Small bilateral pleural effusions noted. 3. Nonspecific 1.3 cm prominent precarinal node seen. 4. Diffuse coronary artery calcifications noted. 5. Borderline aneurysmal dilatation of the ascending thoracic aorta to 4.0 cm in AP dimension. Recommend annual imaging followup by CTA or MRA. This recommendation follows 2010   Dg Abd Portable 1v Result Date: 04/21/2016 Mild constipation.   Scheduled Meds: . allopurinol  300 mg Oral Daily  . clonazePAM  1 mg Oral TID  . diltiazem  180 mg Oral Daily  . docusate sodium  100 mg Oral QHS  . feeding supplement (ENSURE ENLIVE)  237 mL Oral TID BM  . furosemide  20 mg Intravenous Daily  . levofloxacin  750 mg Oral Q24H  . multivitamin with minerals  1 tablet Oral Daily  . pravastatin  40 mg Oral q1800  . tamsulosin  0.4 mg Oral Daily   Continuous Infusions:   LOS: 4 days   MAGICK-Courtnie Brenes [facility] 04/23/2016, 9:58 AM Pager: [phone]  If 7PM-7AM, please contact night-coverage [email or web address] Password [facility] 04/23/2016, 9:58 AM

## 2016-04-23 NOTE — Plan of Care (Signed)
Problem: Activity: Goal: Risk for activity intolerance will decrease Outcome: Progressing Resting in the bed at preent time  Problem: Physical Regulation: Goal: Postoperative complications will be avoided or minimized Outcome: Progressing SCDs are on, no signs of DVT noted  Problem: Pain Management: Goal: Pain level will decrease Outcome: Progressing Medicated once for pain. Resting in the bed with eyes closed at present time

## 2016-04-23 NOTE — Progress Notes (Signed)
  This NP visited patient at the bedside, he is resting quietly in a dark room which is how he "likes it".  He verbalizes no complaints or concerns.  I meet again with  his sister at bedside, Danelle Earthlyoel,   she has many questions regarding decisions related to transition of care.  She is leaning toward a full comfort path and is considering a hospice facility for EOL care if he is eligible.  We completed an updated MOST form reflecting full comfort.  We talked about the importance of being clear regarding desire for life prolonging measures before making referral to a hospice facility  She tells me she is going [place] [facility] [place] and get back to me with questions.  I do believe that if the patient  and family decide to shift to full comfort with no life prolonging interventions he is hospice appropriate.  Questions and concerns addressed   Natural trajectory and expectations at EOL discussed  Time in   1625       Time out 1700           Total time spent on unit 35 min  Greater than 50% of the time was spent in counseling and coordination of care  Discussed with Dr Catalina LungerMyers  Lucresha Dismuke NP  Palliative Medicine Team Team Phone # [phone] Pager [phone]  Patient ID: Ricardo Hendricks, male   DOB: 1950-07-16, 66 y.o.   MRN: [id number]

## 2016-04-23 NOTE — Care Management Important Message (Signed)
Important Message  Patient Details  Name: Ricardo Hendricks MRN: [id number] Date of Birth: 1950/10/18   Medicare Important Message Given:  Yes    Ricardo Hendricks 04/23/2016, 11:18 AM

## 2016-04-24 LAB — BASIC METABOLIC PANEL
Anion gap: 6 (ref 5–15)
BUN: 12 mg/dL (ref 6–20)
CHLORIDE: 102 mmol/L (ref 101–111)
CO2: 29 mmol/L (ref 22–32)
CREATININE: 0.56 mg/dL — AB (ref 0.61–1.24)
Calcium: 8.2 mg/dL — ABNORMAL LOW (ref 8.9–10.3)
GFR calc Af Amer: >60 mL/min (ref >60)
GFR calc non Af Amer: >60 mL/min (ref >60)
Glucose, Bld: 96 mg/dL (ref 65–99)
POTASSIUM: 3.9 mmol/L (ref 3.5–5.1)
SODIUM: 137 mmol/L (ref 135–145)

## 2016-04-24 LAB — CBC
HEMATOCRIT: 22.5 % — AB (ref 39.0–52.0)
HEMOGLOBIN: 7.4 g/dL — AB (ref 13.0–17.0)
MCH: 30.5 pg (ref 26.0–34.0)
MCHC: 32.9 g/dL (ref 30.0–36.0)
MCV: 92.6 fL (ref 78.0–100.0)
Platelets: 145 10*3/uL — ABNORMAL LOW (ref 150–400)
RBC: 2.43 MIL/uL — AB (ref 4.22–5.81)
RDW: 14.7 % (ref 11.5–15.5)
WBC: 7.4 10*3/uL (ref 4.0–10.5)

## 2016-04-24 MED ORDER — TAMSULOSIN HCL 0.4 MG PO CAPS: 0.4000 mg | ORAL_CAPSULE | Freq: Every day | ORAL | 0 refills | Status: AC

## 2016-04-24 MED ORDER — LEVOFLOXACIN 750 MG PO TABS: 750.0000 mg | ORAL_TABLET | ORAL | 0 refills | Status: AC

## 2016-04-24 MED ORDER — IPRATROPIUM-ALBUTEROL 0.5-2.5 (3) MG/3ML IN SOLN: 3.0000 mL | RESPIRATORY_TRACT | 0 refills | Status: AC | PRN

## 2016-04-24 NOTE — Clinical Social Work Note (Signed)
Clinical Social Worker facilitated patient discharge including contacting patient family and facility to confirm patient discharge plans.  Clinical information faxed to facility and family agreeable with plan.  CSW arranged ambulance transport via [facility] (2:30) to [facility]).  RN to call [phone] for report prior to discharge.  Clinical Social Worker will sign off for now as social work intervention is no longer needed. Please consult [place] again if new need arises.  [address] [place] [phone]

## 2016-04-24 NOTE — Progress Notes (Signed)
Orthopedics Progress Note  Subjective: Patient says hip feels better with minimal pain  Objective:  Vitals:   04/23/16 2005 04/24/16 0546  BP: 91/67 94/69  Pulse: 87 98  Resp: 14 16  Temp: 98.4 F (36.9 C) 97.8 F (36.6 C)    General: Awake and alert  Musculoskeletal: no pain with active hip IR/ER, and flexion, leg lengths equal, NVI distally Neurovascularly intact  Lab Results  Component Value Date   WBC 7.4 04/24/2016   HGB 7.4 (L) 04/24/2016   HCT 22.5 (L) 04/24/2016   MCV 92.6 04/24/2016   PLT 145 (L) 04/24/2016       Component Value Date/Time   NA 137 04/24/2016 0441   K 3.9 04/24/2016 0441   CL 102 04/24/2016 0441   CO2 29 04/24/2016 0441   GLUCOSE 96 04/24/2016 0441   BUN 12 04/24/2016 0441   CREATININE 0.56 (L) 04/24/2016 0441   CALCIUM 8.2 (L) 04/24/2016 0441   GFRNONAA >60 04/24/2016 0441   GFRAA >60 04/24/2016 0441    Lab Results  Component Value Date   INR 1.56 04/20/2016    Assessment/Plan:  s/p fall with stable periprosthetic hip fracture - non operative care planned Protected WB and mobilization as per Dr Aundria Rud D/C planning  Almedia Balls. Ranell Patrick, MD 04/24/2016 7:24 AM

## 2016-04-24 NOTE — Discharge Instructions (Signed)
Community-Acquired Pneumonia, Adult °Pneumonia is an infection of the lungs. One type of pneumonia can happen while a person is in a hospital. A different type can happen when a person is not in a hospital (community-acquired pneumonia). It is easy for this kind to spread from person to person. It can spread to you if you breathe near an infected person who coughs or sneezes. Some symptoms include: °· A dry cough. °· A wet (productive) cough. °· Fever. °· Sweating. °· Chest pain. °Follow these instructions at home: °· Take over-the-counter and prescription medicines only as told by your doctor. °¨ Only take cough medicine if you are losing sleep. °¨ If you were prescribed an antibiotic medicine, take it as told by your doctor. Do not stop taking the antibiotic even if you start to feel better. °· Sleep with your head and neck raised (elevated). You can do this by putting a few pillows under your head, or you can sleep in a recliner. °· Do not use tobacco products. These include cigarettes, chewing tobacco, and e-cigarettes. If you need help quitting, ask your doctor. °· Drink enough water to keep your pee (urine) clear or pale yellow. °A shot (vaccine) can help prevent pneumonia. Shots are often suggested for: °· People older than 65 years of age. °· People older than 66 years of age: °¨ Who are having cancer treatment. °¨ Who have long-term (chronic) lung disease. °¨ Who have problems with their body's defense system (immune system). °You may also prevent pneumonia if you take these actions: °· Get the flu (influenza) shot every year. °· Go to the dentist as often as told. °· Wash your hands often. If soap and water are not available, use hand sanitizer. °Contact a doctor if: °· You have a fever. °· You lose sleep because your cough medicine does not help. °Get help right away if: °· You are short of breath and it gets worse. °· You have more chest pain. °· Your sickness gets worse. This is very serious if: °¨ You  are an older adult. °¨ Your body's defense system is weak. °· You cough up blood. °This information is not intended to replace advice given to you by your health care provider. Make sure you discuss any questions you have with your health care provider. °Document Released: 07/01/2007 Document Revised: 06/20/2015 Document Reviewed: 05/09/2014 °Elsevier Interactive Patient Education © 2017 [employer] ° °

## 2016-04-24 NOTE — Progress Notes (Addendum)
Patient discharged to [facility]. Reported called to facility.  Awaiting transport.  Patient sister at bedside to transport belongings and will pick up prescriptions.

## 2016-04-24 NOTE — Discharge Summary (Signed)
Physician Discharge Summary  Ricardo Hendricks ZOX:[id number] DOB: 09-Apr-1950 DOA: 04/19/2016  PCP: [name], MD  Admit date: 04/19/2016 Discharge date: 04/24/2016  Recommendations for Outpatient Follow-up:  1. Pt will need to follow up with PCP as needed per pt preference  2. Please also check CBC to evaluate Hg and Hct levels if pt agrees  3. Please note that Xarelto and Aspirin held due to high risk bleed 4. Please note that pt and family inclined to continue focus more on comfort with no further intervention that would compromise comfort  5. Pt to complete therapy with Levaquin for 5 days  6. Pt follow up with ortho as outlined above  7. Palliative care and hospice to follow at SNF  Discharge Diagnoses:  Principal Problem:   Periprosthetic hip fracture Active Problems:   Essential hypertension   Adult failure to thrive   DNR (do not resuscitate)   Palliative care by specialist  Discharge Condition: Stable  Diet recommendation: Heart healthy diet discussed in details   Brief Narrative: 66 y.o. malewith medical history significant of hypertension, hyperlipidemia, gout, depression with anxiety, atrial fibrillation on Xarelto, hernia of testicle, CAD, CABG. He presented with right hip pain after a fall and found to have a periprosthetic hip fracture.  Assessment & Plan:   Periprosthetic right hip fracture - Sustained after slipping and falling. Seen on CT scan with minimal displacement.  - Orthopedic surgery consulted and recommended non-operative management.  - Physical therapy recommended skilled nursing facility placement for rehab - outpatient follow-up with orthopedic surgery also recommended   Dyspnea with hypoxia - on exam still with rhonchi bilaterally but overall better  - lasix given 20 mg IV x 1 dose - CT chest requested and notable for small pulm nodules, ? Atypical infection vs lymphangitis tumor spread, vs autoimmune illness, not much fluid noted  - I have  placed pt on empiric Levaquin and PCCM consulted over the phone, no further recommendations other than ABX coverage as we are doing already - I have also discussed this with pt's POA his sister who is very clear that goal is for no further interventions and focus of comfort, OK with ABX but open to residential hospice as well  - continue Levaquin day #3, needs five more days of therapy with Levaquin   Leukocytosis - possibly related to an infectious etiology  - CT chest as noted above - continue Levaquin as noted above - WBC is now WNL   Hypotension - SBP remains in low 90-100's - monitor for now   Anemia - Likely secondary to hematoma vs hemorrhage - Xarelto held - transfused one U PRBC 3/28 and Hg up appropriately but again trending down - pt does not want any more blood transfusions  - CBC can be followed per pt's preference   Atrial fibrillation - CHA2DS2-VASc Scoreis 4. Currently in atrial fibrillation with rate in 90's - Discussed risks and benefits of anticoagulation.  - holding xarelto and aspirin due to high risk bleed  - continue Cardizem but further increase in the dose is limited due to hypotension   Essential hypertension - Continue Cardizem  Gout - Continue allopurinol  Hyperlipidemia - Continue pravastatin.  Thrombocytopenia - improving   Depression. Anxiety - Continue Klonopin and Remeron  CAD - s/p CABG. No chest pain. - Continue statin   Acute urinary retention - No associated symptoms/red flags. Likely secondary to narcotics. Blood clot likely developed after previous straight cath attempt - urology, Dr. Berneice Heinrich, recommended tamsulosin and  outpatient follow-up - foley in place   DVT prophylaxis: SCDs Code Status: DNR/DNI Family Communication: sister over the phone  Disposition Plan: Discharge to SNF  Consultants:   Orthopedic surgery  Palliative care   Procedures:   None  Antimicrobials:  Levaquin 3/28  -->  Procedures/Studies: Dg Chest 2 View  Result Date: 04/19/2016 CLINICAL DATA:  Weakness, un- witnessed fall at home EXAM: CHEST  2 VIEW COMPARISON:  03/18/2016 FINDINGS: Cardiomediastinal silhouette is stable. Hyperinflation again noted. Status post median sternotomy. No infiltrate or pulmonary edema. Osteopenia and degenerative changes thoracic spine. IMPRESSION: No active disease. Hyperinflation. Status post median sternotomy. Osteopenia and degenerative changes thoracic spine. Electronically Signed   By: Natasha Mead M.D.   On: 04/19/2016 17:05   Ct Chest Wo Contrast  Result Date: 04/22/2016 CLINICAL DATA:  Follow-up dyspnea.  Subsequent encounter. EXAM: CT CHEST WITHOUT CONTRAST TECHNIQUE: Multidetector CT imaging of the chest was performed following the standard protocol without IV contrast. COMPARISON:  Chest radiograph performed 04/19/2016, and chest images from CT of the abdomen and pelvis performed 12/11/2015 FINDINGS: Cardiovascular: The heart is normal in size. Diffuse coronary artery calcifications are seen, with underlying postoperative change. The patient is status post median sternotomy. The great vessels demonstrate minimal calcification but are otherwise unremarkable. The ascending thoracic aorta measures 4.0 cm in AP dimension. Scattered calcification is seen along the thoracic aorta. Mediastinum/Nodes: A 1.3 cm precarinal node is noted. No additional mediastinal lymphadenopathy is seen. No pericardial effusion is identified. The thyroid gland is unremarkable in appearance. No axillary lymphadenopathy is appreciated. Lungs/Pleura: Small bilateral pleural effusions are noted. There is diffuse interstitial prominence, with multiple scattered nodules of varying size within both lungs, most prominent at the lower lung lobes. Numerous tiny peribronchovascular nodules are seen on the right. The appearance raises concern for atypical infection, though lymphangitic spread of tumor could  conceivably have a similar appearance. Inflammatory conditions such as Wegener's granulomatosis could also have this appearance. All of this appears to be new from November, and is better seen than on the recent prior chest radiograph. Upper Abdomen: The visualized portions of the liver and spleen are grossly unremarkable. Musculoskeletal: No acute osseous abnormalities are identified. The visualized musculature is unremarkable in appearance. Mild degenerative change is noted at the lower cervical spine. IMPRESSION: 1. Diffuse interstitial prominence, with multiple scattered nodules of varying size in both lungs, most prominent at the lower lung lobes. Numerous tiny peribronchovascular nodules on the right. The appearance raises concern for atypical infection, though lymphangitic spread of tumor could conceivably have a similar appearance. Inflammatory conditions such as Wegener's granulomatosis could also have this appearance. All this appears to be new from November, and is better seen than on the recent prior chest radiograph. 2. Small bilateral pleural effusions noted. 3. Nonspecific 1.3 cm prominent precarinal node seen. 4. Diffuse coronary artery calcifications noted. 5. Borderline aneurysmal dilatation of the ascending thoracic aorta to 4.0 cm in AP dimension. Recommend annual imaging followup by CTA or MRA. This recommendation follows 2010 ACCF/AHA/AATS/ACR/ASA/SCA/SCAI/SIR/STS/SVM Guidelines for the Diagnosis and Management of Patients with Thoracic Aortic Disease. Circulation. 2010; 121: Z610-R604. Electronically Signed   By: Roanna Raider M.D.   On: 04/22/2016 21:40   Ct Hip Right Wo Contrast  Result Date: 04/19/2016 CLINICAL DATA:  Right hip fracture, unwitnessed fall this afternoon. Closed fracture, initial encounter. EXAM: CT OF THE RIGHT HIP WITHOUT CONTRAST TECHNIQUE: Multidetector CT imaging of the right hip was performed according to the standard protocol. Multiplanar CT image reconstructions  were also generated. COMPARISON:  Radiographs earlier this day. Reformats from abdominal CT 12/11/2015 FINDINGS: Bones/Joint/Cartilage Right hip total arthroplasty in expected alignment. Periprosthetic fracture involves the proximal femoral stem, lesser and greater trochanters. Fracture is minimally displaced. No significant angulation. No additional fracture of the right hemipelvis. Pubic rami are intact. Right sacroiliac joint and pubic symphysis are congruent. Ligaments Suboptimally assessed by CT. Muscles and Tendons Edematous heterogeneous appearance of the right iliopsoas and gluteal musculature, likely hematoma in the setting of acute injury. Soft tissues Dense vascular calcifications. Diffuse soft tissue edema. Paucity of body wall fat can be seen with cachexia. IMPRESSION: Minimally displaced periprosthetic fracture about right hip prosthesis involving the proximal femoral stem, lesser and greater trochanters. Heterogeneous enlargement of the iliopsoas and gluteal musculature likely hemorrhage/hematoma in the setting of acute injury. Electronically Signed   By: Rubye Oaks M.D.   On: 04/19/2016 22:16   Dg Knee Complete 4 Views Left  Result Date: 04/19/2016 CLINICAL DATA:  Status post fall at home, weakness EXAM: LEFT KNEE - COMPLETE 4+ VIEW COMPARISON:  None. FINDINGS: Four views of the left knee submitted. No acute fracture or subluxation. Postsurgical changes with 2 wood transverse metallic fixation screws are noted proximal tibia. Diffuse osteopenia is noted. No acute fracture or subluxation. There is diffuse narrowing of joint space. Narrowing of patellofemoral joint space. Tiny joint effusion. IMPRESSION: No acute fracture or subluxation. Diffuse osteopenia. Osteoarthritic changes as described above. Spot tiny joint effusion. Postsurgical changes with metallic fixation material proximal left tibia. Electronically Signed   By: Natasha Mead M.D.   On: 04/19/2016 17:07   Dg Abd Portable  1v  Result Date: 04/21/2016 CLINICAL DATA:  Constipation EXAM: PORTABLE ABDOMEN - 1 VIEW COMPARISON:  None. FINDINGS: Scattered large and small bowel gas is noted. Mild fecal material is noted within the colon consistent with constipation. No obstructive changes are seen. Postsurgical changes in the right hip are noted. IMPRESSION: Mild constipation. Electronically Signed   By: Alcide Clever M.D.   On: 04/21/2016 12:29   Dg Hip Unilat W Or Wo Pelvis 2-3 Views Right  Result Date: 04/19/2016 CLINICAL DATA:  Pain after fall. EXAM: DG HIP (WITH OR WITHOUT PELVIS) 2-3V RIGHT COMPARISON:  December 11, 2015 CT scan FINDINGS: The patient is status post right hip replacement. The acetabular component is in good position as is the femoral component. There is a step-off/contour abnormality inferior to the right greater trochanter suggesting acute fracture. Subtle step-off in the region of the inferior trochanter. No other fractures are identified. IMPRESSION: The findings are most consistent with an acute fracture seen just inferior to the right greater trochanter and medially at the level of the inferior trochanter. Electronically Signed   By: Gerome Sam III M.D   On: 04/19/2016 17:11   Dg Femur Min 2 Views Right  Result Date: 04/19/2016 CLINICAL DATA:  Fall today. Confirmed right hip fx on earlier films. EXAM: RIGHT FEMUR 2 VIEWS COMPARISON:  Right hip same day FINDINGS: Three views of the right femur submitted. Study is limited by diffuse osteopenia. Right hip prosthesis partially visualized. No distal femoral fracture is noted. Again noted nondisplaced fracture in proximal right femur just inferior to greater femoral trochanter region. IMPRESSION: Study is limited by diffuse osteopenia. Right hip prosthesis partially visualized. No distal femoral fracture is noted. Again noted nondisplaced fracture in proximal right femur just inferior to greater femoral trochanter region. Electronically Signed   By: Natasha Mead M.D.   On: 04/19/2016 18:55  Discharge Exam: Vitals:   04/23/16 2005 04/24/16 0546  BP: 91/67 94/69  Pulse: 87 98  Resp: 14 16  Temp: 98.4 F (36.9 C) 97.8 F (36.6 C)   Vitals:   04/23/16 0900 04/23/16 1723 04/23/16 2005 04/24/16 0546  BP: 95/69 93/72 91/67  94/69  Pulse: (!) 112 (!) 115 87 98  Resp:   14 16  Temp:  98.2 F (36.8 C) 98.4 F (36.9 C) 97.8 F (36.6 C)  TempSrc:   Oral Oral  SpO2: 97% 100% 97% 99%  Weight:    62.7 kg (138 lb 4.8 oz)  Height:        General: Pt is alert, not in acute distress Cardiovascular: Irregular rate and rhythm, no rubs, no gallops Respiratory: Clear to auscultation bilaterally, no wheezing, no crackles, no rhonchi Abdominal: Soft, non tender, non distended, bowel sounds +, no guarding  Discharge Instructions  Discharge Instructions    Diet - low sodium heart healthy    Complete by:  As directed    Increase activity slowly    Complete by:  As directed      Allergies as of 04/24/2016   No Known Allergies     Medication List    STOP taking these medications   aspirin EC 81 MG tablet   cefUROXime 250 MG tablet Commonly known as:  CEFTIN   DILTIAZEM CD 240 MG 24 hr capsule Generic drug:  diltiazem   rivaroxaban 20 MG Tabs tablet Commonly known as:  XARELTO     TAKE these medications   acetaminophen 500 MG tablet Commonly known as:  TYLENOL Take 500 mg by mouth at bedtime.   allopurinol 300 MG tablet Commonly known as:  ZYLOPRIM Take 300 mg by mouth daily.   bisacodyl 10 MG suppository Commonly known as:  DULCOLAX Place 1 suppository (10 mg total) rectally daily as needed for moderate constipation.   clonazePAM 1 MG tablet Commonly known as:  KLONOPIN Take 1 tablet (1 mg total) by mouth 3 (three) times daily.   diltiazem 180 MG 24 hr capsule Commonly known as:  DILACOR XR Take 180 mg by mouth daily.   docusate sodium 100 MG capsule Commonly known as:  COLACE Take 100 mg by mouth at bedtime.    feeding supplement (ENSURE ENLIVE) Liqd Take 237 mLs by mouth 2 (two) times daily between meals.   ipratropium-albuterol 0.5-2.5 (3) MG/3ML Soln Commonly known as:  DUONEB Take 3 mLs by nebulization every 4 (four) hours as needed.   levofloxacin 750 MG tablet Commonly known as:  LEVAQUIN Take 1 tablet (750 mg total) by mouth daily.   meloxicam 7.5 MG tablet Commonly known as:  MOBIC Take 7.5 mg by mouth at bedtime as needed for pain. What changed:  Another medication with the same name was removed. Continue taking this medication, and follow the directions you see here.   multivitamin capsule Take 1 capsule by mouth daily.   polyethylene glycol packet Commonly known as:  MIRALAX / GLYCOLAX Take 17 g by mouth daily.   pravastatin 40 MG tablet Commonly known as:  PRAVACHOL Take 40 mg by mouth daily.   tamsulosin 0.4 MG Caps capsule Commonly known as:  FLOMAX Take 1 capsule (0.4 mg total) by mouth daily.      Follow-up Information    FURR,SARA, MD. Schedule an appointment as soon as possible for a visit.   Specialty:  Internal Medicine Contact information: [address] [place] [address] [phone]  Yolonda Kida, MD. Schedule an appointment as soon as possible for a visit in 2 week(s).   Specialty:  Orthopedic Surgery Contact information: [address] [place] [place] [address] [phone]            The results of significant diagnostics from this hospitalization (including imaging, microbiology, ancillary and laboratory) are listed below for reference.     Microbiology: Recent Results (from the past 240 hour(s))  Culture, blood (Routine X 2) w Reflex to ID Panel     Status: None (Preliminary result)   Collection Time: 04/20/16 12:28 AM  Result Value Ref Range Status   Specimen Description BLOOD RIGHT ANTECUBITAL  Final   Special Requests BOTTLES DRAWN AEROBIC AND ANAEROBIC 5CC EA  Final   Culture NO GROWTH  3 DAYS  Final   Report Status PENDING  Incomplete  Culture, blood (Routine X 2) w Reflex to ID Panel     Status: None (Preliminary result)   Collection Time: 04/20/16 12:30 AM  Result Value Ref Range Status   Specimen Description BLOOD RIGHT HAND  Final   Special Requests IN PEDIATRIC BOTTLE 3CC  Final   Culture NO GROWTH 3 DAYS  Final   Report Status PENDING  Incomplete  Surgical pcr screen     Status: None   Collection Time: 04/20/16  2:00 AM  Result Value Ref Range Status   MRSA, PCR NEGATIVE NEGATIVE Final   Staphylococcus aureus NEGATIVE NEGATIVE Final    Comment:        The Xpert SA Assay (FDA approved for NASAL specimens in patients over 57 years of age), is one component of a comprehensive surveillance program.  Test performance has been validated by [facility] for patients greater than or equal to 65 year old. It is not intended to diagnose infection nor to guide or monitor treatment.   Culture, Urine     Status: None   Collection Time: 04/21/16  9:34 AM  Result Value Ref Range Status   Specimen Description URINE, CATHETERIZED  Final   Special Requests NONE  Final   Culture NO GROWTH  Final   Report Status 04/22/2016 FINAL  Final  Culture, blood (routine x 2) Call MD if unable to obtain prior to antibiotics being given     Status: None (Preliminary result)   Collection Time: 04/22/16  1:15 PM  Result Value Ref Range Status   Specimen Description BLOOD LEFT ARM  Final   Special Requests BOTTLES DRAWN AEROBIC AND ANAEROBIC 6CC EACH  Final   Culture NO GROWTH 1 DAY  Final   Report Status PENDING  Incomplete  Culture, blood (routine x 2) Call MD if unable to obtain prior to antibiotics being given     Status: None (Preliminary result)   Collection Time: 04/22/16  1:30 PM  Result Value Ref Range Status   Specimen Description BLOOD LEFT HAND  Final   Special Requests IN PEDIATRIC BOTTLE 1CC  Final   Culture NO GROWTH 1 DAY  Final   Report Status PENDING  Incomplete      Labs: Basic Metabolic Panel:  Recent Labs Lab 04/19/16 1600 04/22/16 0228 04/23/16 0800 04/24/16 0441  NA 141 139 137 137  K 4.6 4.4 3.7 3.9  CL 102 106 100* 102  CO2 GLUCOSE 142* 96 116* 96  BUN CREATININE 1.07 0.56* 0.64 0.56*  CALCIUM 9.3 8.1* 8.2* 8.2*   Liver Function Tests:  Recent  Labs Lab 04/19/16 1600  AST 54*  ALT 17  ALKPHOS 120  BILITOT 0.6  PROT 6.6  ALBUMIN 3.3*   CBC:  Recent Labs Lab 04/19/16 1600  04/21/16 0459 04/21/16 1102 04/22/16 0228 04/23/16 0800 04/24/16 0441  WBC 23.8*  < > 11.3* 11.9* 9.7 12.3* 7.4  NEUTROABS 22.4*  --   --   --   --   --   --   HGB 12.8*  < > 7.5* 7.3* 7.2* 8.3* 7.4*  HCT 38.9*  < > 23.1* 22.2* 21.8* 25.9* 22.5*  MCV 93.5  < > 95.1 94.9 94.4 92.2 92.6  PLT 262  < > 156 130* 141* 147* 145*  < > = values in this interval not displayed. Cardiac Enzymes:  Recent Labs Lab 04/19/16 1600  CKTOTAL 150   BNP: BNP (last 3 results)  Recent Labs  03/19/16 1518  BNP 643.0*    SIGNED: Time coordinating discharge: 30 minutes  Debbora Presto, MD  [facility] 04/24/2016, 9:05 AM Pager [phone]  If 7PM-7AM, please contact night-coverage [email or web address] Password [email or web address]

## 2016-04-24 NOTE — Plan of Care (Signed)
Problem: Activity: Goal: Risk for activity intolerance will decrease Outcome: Progressing Resting in the bedshift  Problem: Bowel/Gastric: Goal: Will not experience complications related to bowel motility Outcome: Progressing No bowel or gastric issues noted, refused Colace due to diarrhea  Problem: Physical Regulation: Goal: Will remain free from infection Outcome: Progressing No S/S of infection noted  Problem: Pain Management: Goal: Pain level will decrease Outcome: Progressing Medicated once for pain this shift with full relief

## 2016-04-25 LAB — CULTURE, BLOOD (ROUTINE X 2)
CULTURE: NO GROWTH
CULTURE: NO GROWTH

## 2016-04-27 LAB — CULTURE, BLOOD (ROUTINE X 2)
Culture: NO GROWTH
Culture: NO GROWTH

## 2016-07-26 DEATH — deceased

## 2017-06-30 IMAGING — CT CT CHEST W/O CM
2 of 3 series · 12 of 36 positions shown, 15 images · non-contrast
Comparison: Chest radiograph performed 04/19/2016, and chest images
from CT of the abdomen and pelvis performed 12/11/2015

CLINICAL DATA: Follow-up dyspnea.  Subsequent encounter.

EXAM:
CT CHEST WITHOUT CONTRAST
TECHNIQUE: Multidetector CT imaging of the chest was performed following the
standard protocol without IV contrast.

[Series 201: chest without, idose (2) · axial · non-contrast · 0.68mm/px · z∈[+669,+927]mm · 9 of 123 slices shown, 12 images]
[im 10/123  mediastinal]
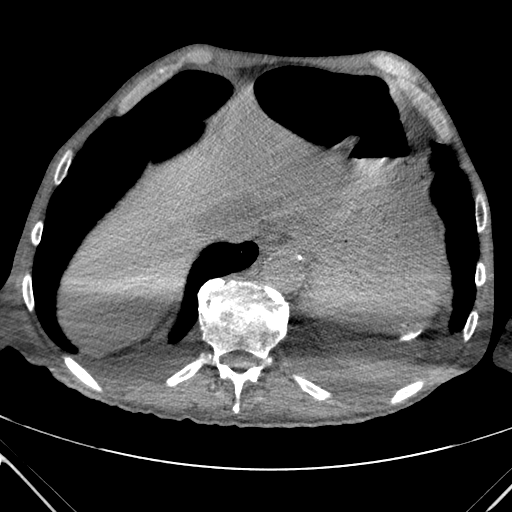
[im 10/123  lung]
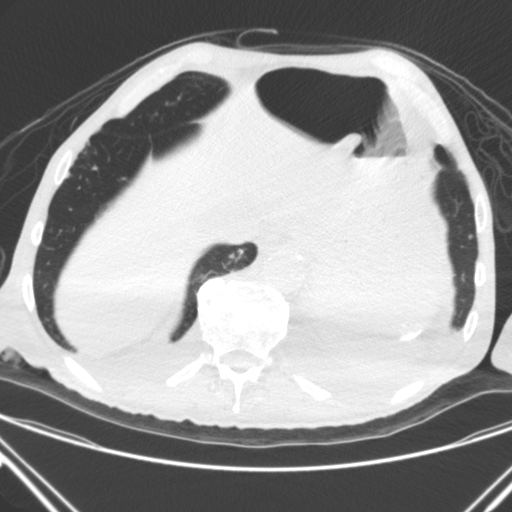
[im 23/123  lung]
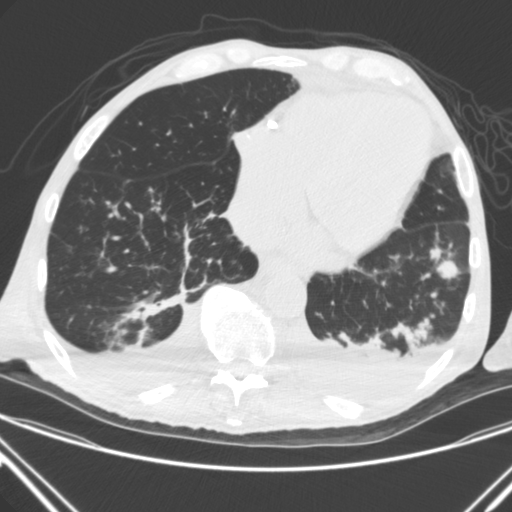
[im 37/123  lung]
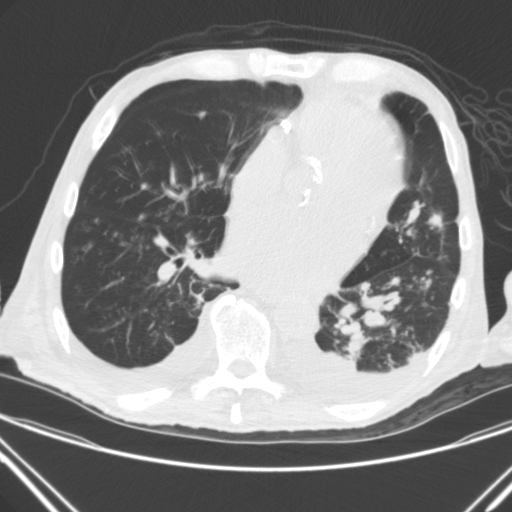
[im 50/123  lung]
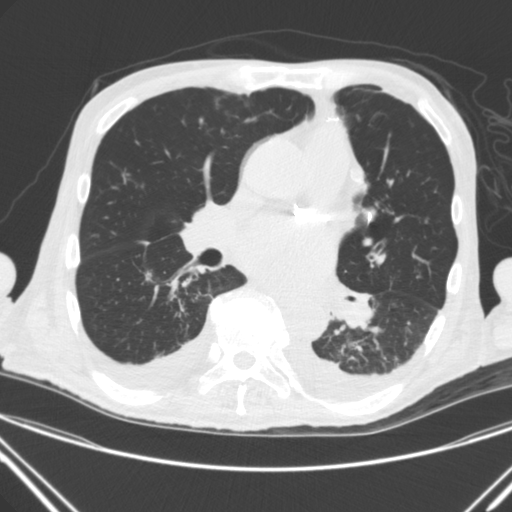
[im 64/123  mediastinal]
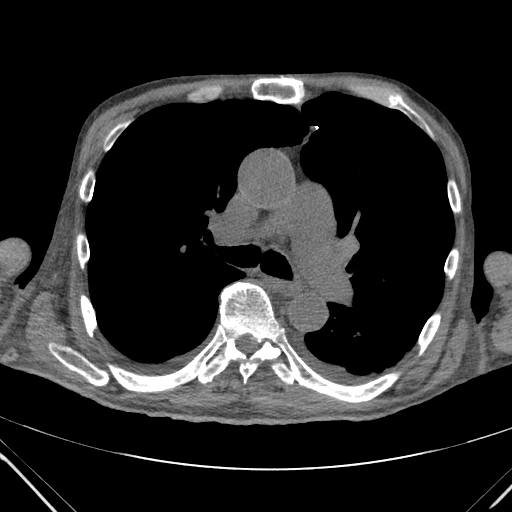
[im 64/123  lung]
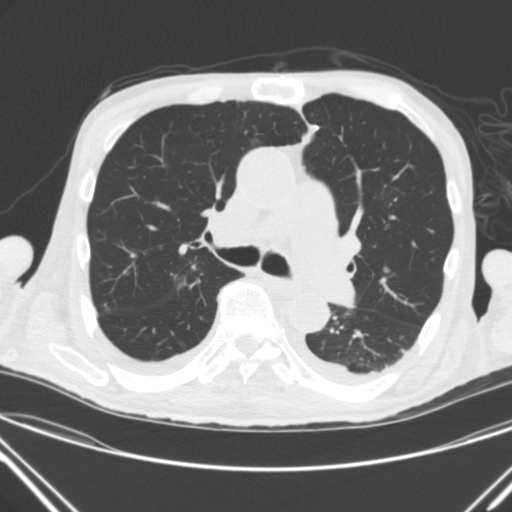
[im 73/123  lung]
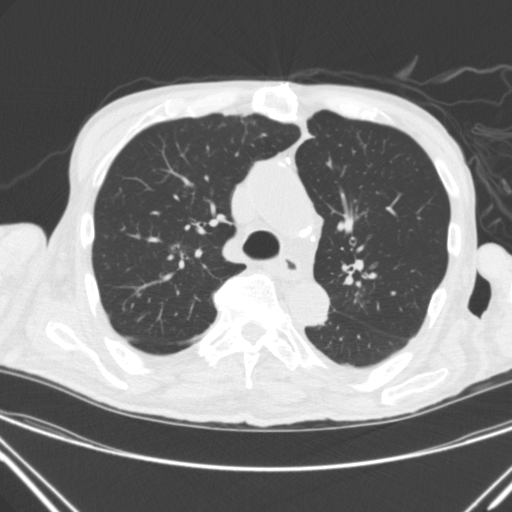
[im 86/123  lung]
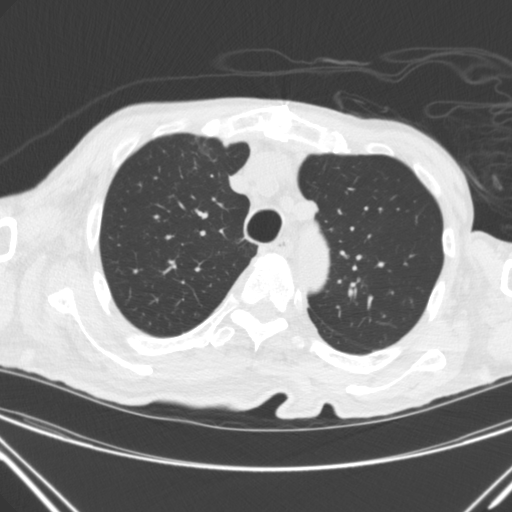
[im 100/123  lung]
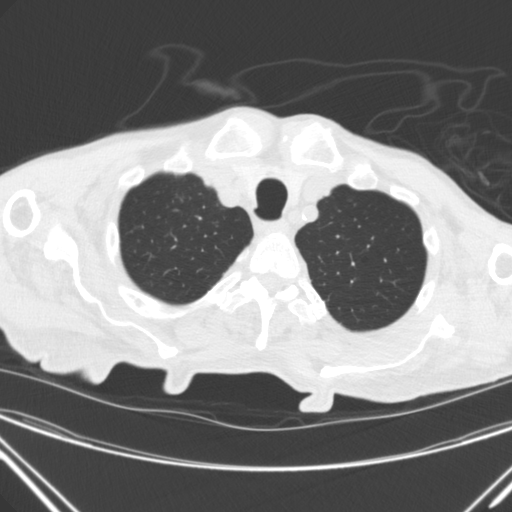
[im 113/123  mediastinal]
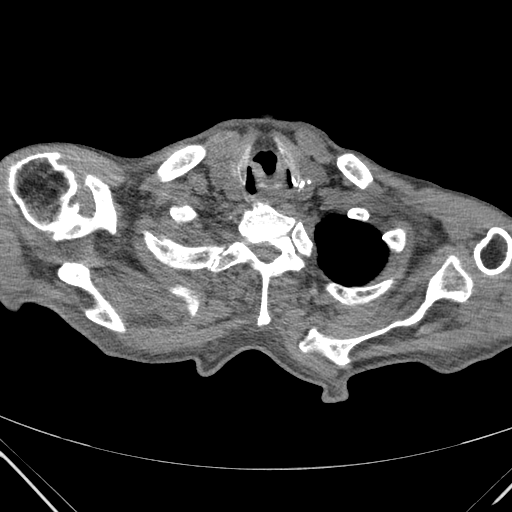
[im 113/123  lung]
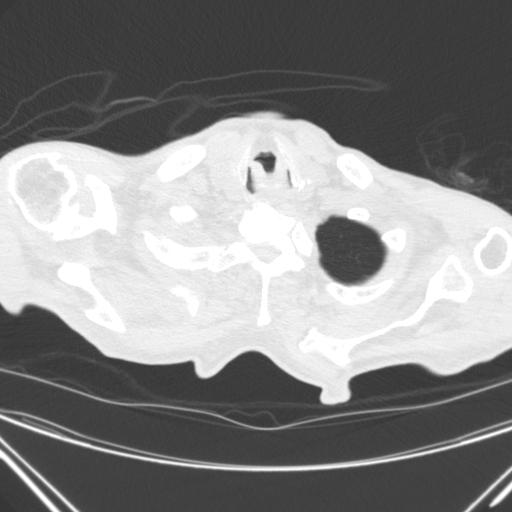

[Series 203: coronal, idose (2) · coronal · 0.45mm/px · 3 of 119 slices shown]
[im 24/119  lung]
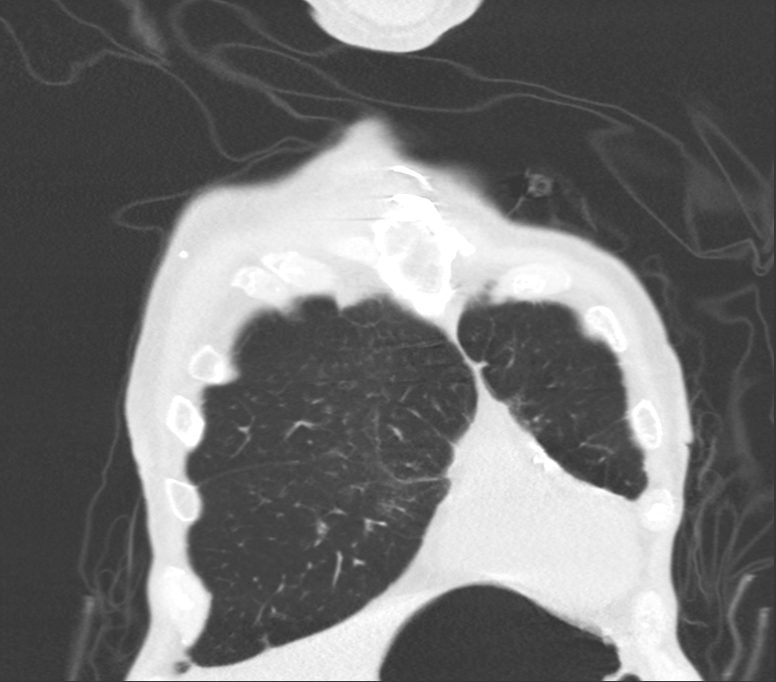
[im 48/119  lung]
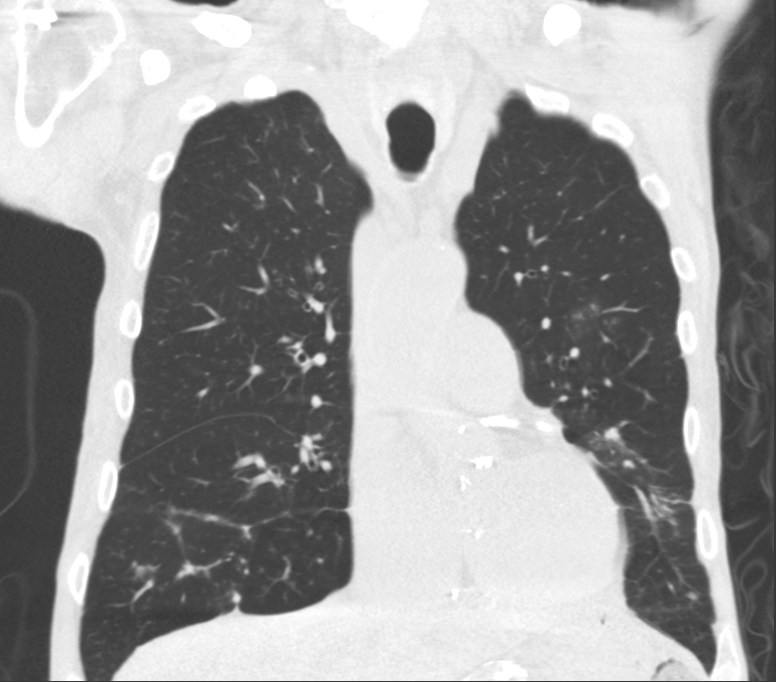
[im 71/119  lung]
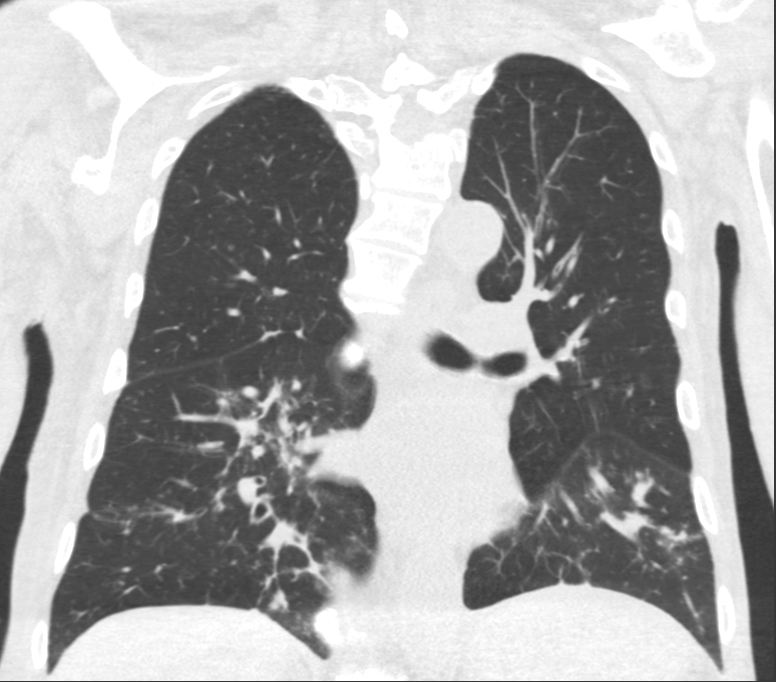

[12 of 36 positions shown; findings below may reference images not displayed]

FINDINGS: Cardiovascular: The heart is normal in size. Diffuse coronary artery
calcifications are seen, with underlying postoperative change. The
patient is status post median sternotomy. The great vessels
demonstrate minimal calcification but are otherwise unremarkable.
The ascending thoracic aorta measures 4.0 cm in AP dimension.
Scattered calcification is seen along the thoracic aorta.

Mediastinum/Nodes: A 1.3 cm precarinal node is noted. No additional
mediastinal lymphadenopathy is seen. No pericardial effusion is
identified. The thyroid gland is unremarkable in appearance. No
axillary lymphadenopathy is appreciated.

Lungs/Pleura: Small bilateral pleural effusions are noted. There is
diffuse interstitial prominence, with multiple scattered nodules of
varying size within both lungs, most prominent at the lower lung
lobes. Numerous tiny peribronchovascular nodules are seen on the
right.

The appearance raises concern for atypical infection, though
lymphangitic spread of tumor could conceivably have a similar
appearance. Inflammatory conditions such as Wegener's granulomatosis
could also have this appearance. All of this appears to be new from
[place], and is better seen than on the recent prior chest
radiograph.

Upper Abdomen: The visualized portions of the liver and spleen are
grossly unremarkable.

Musculoskeletal: No acute osseous abnormalities are identified. The
visualized musculature is unremarkable in appearance. Mild
degenerative change is noted at the lower cervical spine.
IMPRESSION: 1. Diffuse interstitial prominence, with multiple scattered nodules
of varying size in both lungs, most prominent at the lower lung
lobes. Numerous tiny peribronchovascular nodules on the right. The
appearance raises concern for atypical infection, though
lymphangitic spread of tumor could conceivably have a similar
appearance. Inflammatory conditions such as Wegener's granulomatosis
could also have this appearance. All this appears to be new from
[place], and is better seen than on the recent prior chest
radiograph.
2. Small bilateral pleural effusions noted.
3. Nonspecific 1.3 cm prominent precarinal node seen.
4. Diffuse coronary artery calcifications noted.
5. Borderline aneurysmal dilatation of the ascending thoracic aorta
to 4.0 cm in AP dimension. Recommend annual imaging followup by CTA
or MRA. This recommendation follows 2272
ACCF/AHA/AATS/ACR/ASA/SCA/KIYOHISA/KOVARI/NEUTEL/BICE Guidelines for the
Diagnosis and Management of Patients with Thoracic Aortic Disease.
Circulation. 2272; 121: [id number].
